# Patient Record
Sex: Male | Born: 1955 | Race: Black or African American | Hispanic: No | Marital: Married | Smoking: Former smoker
Health system: Southern US, Community
[De-identification: ages and names within clinical notes are randomized; demographics above are authoritative.]

## PROBLEM LIST (undated history)

## (undated) DIAGNOSIS — I10 Essential (primary) hypertension: Secondary | ICD-10-CM

## (undated) DIAGNOSIS — R32 Unspecified urinary incontinence: Secondary | ICD-10-CM

## (undated) DIAGNOSIS — E119 Type 2 diabetes mellitus without complications: Secondary | ICD-10-CM

## (undated) DIAGNOSIS — I639 Cerebral infarction, unspecified: Secondary | ICD-10-CM

## (undated) DIAGNOSIS — R159 Full incontinence of feces: Secondary | ICD-10-CM

## (undated) DIAGNOSIS — E785 Hyperlipidemia, unspecified: Secondary | ICD-10-CM

## (undated) HISTORY — DX: Hyperlipidemia, unspecified: E78.5

---

## 2002-02-12 ENCOUNTER — Ambulatory Visit: Admission: RE | Admit: 2002-02-12 | Discharge: 2002-02-12 | Payer: Self-pay | Admitting: Family Medicine

## 2002-03-14 ENCOUNTER — Emergency Department (HOSPITAL_COMMUNITY): Admission: EM | Admit: 2002-03-14 | Discharge: 2002-03-14 | Payer: Self-pay | Admitting: Emergency Medicine

## 2004-02-22 ENCOUNTER — Ambulatory Visit (HOSPITAL_BASED_OUTPATIENT_CLINIC_OR_DEPARTMENT_OTHER): Admission: RE | Admit: 2004-02-22 | Discharge: 2004-02-22 | Payer: Self-pay | Admitting: Family Medicine

## 2009-08-07 ENCOUNTER — Emergency Department (HOSPITAL_COMMUNITY): Admission: EM | Admit: 2009-08-07 | Discharge: 2009-08-07 | Payer: Self-pay | Admitting: Family Medicine

## 2009-08-13 ENCOUNTER — Encounter: Admission: RE | Admit: 2009-08-13 | Discharge: 2009-08-13 | Payer: Self-pay | Admitting: Podiatrist

## 2012-04-29 ENCOUNTER — Telehealth: Payer: Self-pay | Admitting: Urgent Care

## 2012-04-29 LAB — BASIC METABOLIC PANEL
AST: 17 U/L
Albumin: 4
Bilirubin, Indirect: 0.4
Chloride: 105 mmol/L
Sodium: 142 mmol/L (ref 137–147)
Total Bilirubin: 0.5 mg/dL
Total Protein: 6.9 g/dL

## 2012-04-29 NOTE — Telephone Encounter (Signed)
Pt called to be set up for his TCS per Dr [name]. He isn't having problems, just due for procedure. Please call him for a triage at [phone]

## 2012-04-29 NOTE — Telephone Encounter (Signed)
Pt called back. Said Dr. Rosemary Holms referred him. He is having problems with constipation and now has an OV on 05/08/2012 @ 10:30 AM with Laban Emperor, NP.

## 2012-05-01 ENCOUNTER — Emergency Department (HOSPITAL_COMMUNITY)
Admission: EM | Admit: 2012-05-01 | Discharge: 2012-05-01 | Disposition: A | Discharge status: Home / Self Care | Payer: 59 | Attending: Emergency Medicine | Admitting: Emergency Medicine

## 2012-05-01 ENCOUNTER — Encounter (HOSPITAL_COMMUNITY): Payer: Self-pay | Admitting: Emergency Medicine

## 2012-05-01 ENCOUNTER — Emergency Department (HOSPITAL_COMMUNITY): Payer: 59

## 2012-05-01 DIAGNOSIS — F17200 Nicotine dependence, unspecified, uncomplicated: Secondary | ICD-10-CM | POA: Insufficient documentation

## 2012-05-01 DIAGNOSIS — R51 Headache: Secondary | ICD-10-CM | POA: Insufficient documentation

## 2012-05-01 DIAGNOSIS — I10 Essential (primary) hypertension: Secondary | ICD-10-CM

## 2012-05-01 HISTORY — DX: Essential (primary) hypertension: I10

## 2012-05-01 LAB — BASIC METABOLIC PANEL
CO2: 29 mEq/L (ref 19–32)
Chloride: 102 mEq/L (ref 96–112)
GFR calc Af Amer: >90 mL/min (ref >90)
GFR calc non Af Amer: >90 mL/min (ref >90)
Glucose, Bld: 98 mg/dL (ref 70–99)

## 2012-05-01 MED ORDER — ACETAMINOPHEN 500 MG PO TABS
1000.0000 mg | ORAL_TABLET | Freq: Once | ORAL | Status: AC
  Administered 2012-05-01: 1000 mg via ORAL
  Filled 2012-05-01: qty 2

## 2012-05-01 MED ORDER — POTASSIUM CHLORIDE CRYS ER 20 MEQ PO TBCR
40.0000 meq | EXTENDED_RELEASE_TABLET | Freq: Once | ORAL | Status: AC
  Administered 2012-05-01: 40 meq via ORAL
  Filled 2012-05-01: qty 2

## 2012-05-01 MED ORDER — BUTALBITAL-APAP-CAFF-COD 50-325-40-30 MG PO CAPS: 1.0000 | ORAL_CAPSULE | ORAL | Status: AC | PRN

## 2012-05-01 MED ORDER — HYDROCHLOROTHIAZIDE 25 MG PO TABS
25.0000 mg | ORAL_TABLET | Freq: Every day | ORAL | Status: DC
  Administered 2012-05-01: 25 mg via ORAL
  Filled 2012-05-01 (×2): qty 1

## 2012-05-01 MED ORDER — HYDROCHLOROTHIAZIDE 25 MG PO TABS: 25.0000 mg | ORAL_TABLET | Freq: Every day | ORAL | Status: DC

## 2012-05-01 NOTE — ED Provider Notes (Signed)
History     CSN: [id number]  Arrival date & time 05/01/12  0815   None     Chief Complaint  Patient presents with  . Hypertension    (Consider location/radiation/quality/duration/timing/severity/associated sxs/prior treatment) HPI Comments: Patient states he has a history of hypertension. He has a local physician and was ordered medication for his blood pressure in February of this year. He used the medication up until April when he ran out. Patient states he has had problems with his job and his insurance, and has not been back to his physician to get the prescription refilled from April up until now. The patient presents to the emergency department because he is having headaches when with cough. States that his head feels as though was going to explode when he coughs. He describes a mild throbbing type headache when he is not coughing. The patient denies any loss of consciousness, changes in vision, changes in speech, extremity weakness, or changes in his ability to walk. The patient states that he was seen by the physician on Friday, July 12. He was given another prescription but could not afford to get it filled. He presents at this time for evaluation of his headache and also his blood pressure.  The history is provided by the patient.    Past Medical History  Diagnosis Date  . Hypertension     History reviewed. No pertinent past surgical history.  History reviewed. No pertinent family history.  History  Substance Use Topics  . Smoking status: Current Everyday Smoker -- 1.0 packs/day    Types: Cigarettes  . Smokeless tobacco: Not on file  . Alcohol Use: Yes     occ      Review of Systems  Constitutional: Negative for activity change.       All ROS Neg except as noted in HPI  HENT: Negative for nosebleeds and neck pain.   Eyes: Negative for photophobia and discharge.  Respiratory: Negative for cough, shortness of breath and wheezing.   Cardiovascular: Negative for  chest pain and palpitations.  Gastrointestinal: Negative for abdominal pain and blood in stool.  Genitourinary: Negative for dysuria, frequency and hematuria.  Musculoskeletal: Negative for back pain and arthralgias.  Skin: Negative.   Neurological: Positive for headaches. Negative for dizziness, seizures and speech difficulty.  Psychiatric/Behavioral: Negative for hallucinations and confusion.    Allergies  Bee venom  Home Medications  No current outpatient prescriptions on file.  BP 205/97  Pulse 58  Temp 98.2 F (36.8 C) (Oral)  Resp 16  Ht 5\' 4"  (1.626 m)  Wt 160 lb (72.576 kg)  BMI 27.46 kg/m2  SpO2 100%  Physical Exam  Nursing note and vitals reviewed. Constitutional: He is oriented to person, place, and time. He appears well-developed and well-nourished.  Non-toxic appearance.  HENT:  Head: Normocephalic.  Right Ear: Tympanic membrane and external ear normal.  Left Ear: Tympanic membrane and external ear normal.  Eyes: EOM and lids are normal. Pupils are equal, round, and reactive to light.  Neck: Normal range of motion. Neck supple. Carotid bruit is not present.  Cardiovascular: Regular rhythm, intact distal pulses and normal pulses.  Bradycardia present.   Murmur heard.  Systolic murmur is present  Pulmonary/Chest: Breath sounds normal. No respiratory distress.  Abdominal: Soft. Bowel sounds are normal. There is no tenderness. There is no guarding.  Musculoskeletal: Normal range of motion.  Lymphadenopathy:       Head (right side): No submandibular adenopathy present.  Head (left side): No submandibular adenopathy present.    He has no cervical adenopathy.  Neurological: He is alert and oriented to person, place, and time. He has normal strength. No cranial nerve deficit or sensory deficit. He exhibits normal muscle tone. Coordination normal.       No motor or sensory deficit. Speech clear.  Skin: Skin is warm and dry.  Psychiatric: He has a normal mood  and affect. His speech is normal.    ED Course  Procedures (including critical care time)  Labs Reviewed - No data to display No results found.   No diagnosis found.    MDM  I have reviewed nursing notes, vital signs, and all appropriate lab and imaging results for this patient. Basic metabolic panel reveals the potassium to be 3.3. 40 mEq of potassium by mouth given to the patient. CT scan of the head reveals atrophy with small vessel chronic ischemic changes there is an old lacunar infarct but no acute changes noted. The patient's blood pressure improved from a systolic of [id number] systolic of [id number]. Patient is prescribed hydrochlorothiazide 1 tablet daily he is also given a prescription for fioricet with codeine #3 for headache. Patient is advised to see his primary physician in the next 7-10 days for recheck of his blood pressure and followup. He is invited to return to the emergency department if any changes, problems, or concerns.       Lenox Ahr, [place] 05/01/12 1032

## 2012-05-01 NOTE — ED Notes (Signed)
Pt states stopped taking BP meds about 6 months ago. Pt complaining of headache and throbbing feeling when he coughs in head.

## 2012-05-01 NOTE — ED Provider Notes (Signed)
Medical screening examination/treatment/procedure(s) were performed by non-physician practitioner and as supervising physician I was immediately available for consultation/collaboration.   Sylvia Kondracki L Caesar Mannella, MD 05/01/12 1559 

## 2012-05-08 ENCOUNTER — Ambulatory Visit: Payer: Self-pay | Admitting: Gastroenterology

## 2012-05-09 ENCOUNTER — Ambulatory Visit (INDEPENDENT_AMBULATORY_CARE_PROVIDER_SITE_OTHER): Payer: 59 | Admitting: Gastroenterology

## 2012-05-09 ENCOUNTER — Encounter: Payer: Self-pay | Admitting: Gastroenterology

## 2012-05-09 VITALS — BP 192/93 | HR 58 | Temp 98.6°F | Ht 64.0 in | Wt 152.6 lb

## 2012-05-09 DIAGNOSIS — R198 Other specified symptoms and signs involving the digestive system and abdomen: Secondary | ICD-10-CM

## 2012-05-09 DIAGNOSIS — R194 Change in bowel habit: Secondary | ICD-10-CM

## 2012-05-09 NOTE — Assessment & Plan Note (Signed)
56 year old male with abrupt change in bowel habits, onset 6 months ago. Previously BM daily to every other day and well-formed. Now, with postprandial urgency occuring 1-2x per week with watery loose stool, otherwise no bowel movements. Associated with wt loss of 7-8 lbs, loss of appetite. No abdominal pain. No rectal bleeding. Ultimately needs TCS, but I am concerned about semi-obstructive process. Obtain CT first. After review and if no abnormalities, proceed with TCS in very near future. Pt aware. The R/B/A have been discussed in detail, and he understands. Probiotics provided today.

## 2012-05-09 NOTE — Progress Notes (Signed)
Referring Provider: [name], MD Primary Care Physician:  Seth Battiest, MD Primary Gastroenterologist:  Seth Neal   Chief Complaint  Patient presents with  . Diarrhea  . Constipation  . Colonoscopy    HPI:   56 year old male who presents today at the request of Seth Neal for initial screening colonoscopy. Unfortunately, he is overdue for this. His BP is quite elevated today, but he is asymptomatic. He has not taken his medication today. Seth Neal reports an abrupt change in bowel habits starting 6 months ago. Notes significantly watery, loose stools, occuring 1-2 times per week, usually postprandially. Prior to this would have a BM once a day or every other day, well-formed. He has associated lack of appetite and self-reported wt loss of 7-8 lbs. Denies N/V. He denies any sick contacts, use of abx, change in medication. Has city water. He has had no formed stool for the past 6 months.   Outside labs reviewed with normal BUN, Cr, and LFTs.   Past Medical History  Diagnosis Date  . Hypertension     Past Surgical History  Procedure Date  . None     Current Outpatient Prescriptions  Medication Sig Dispense Refill  . hydrochlorothiazide (HYDRODIURIL) 25 MG tablet Take 1 tablet (25 mg total) by mouth daily.  30 tablet  0  . butalbital-acetaminophen-caffeine (FIORICET/CODEINE) 50-325-40-30 MG per capsule Take 1 capsule by mouth every 4 (four) hours as needed for headache. Take with food  20 capsule  0    Allergies as of 05/09/2012 - Review Complete 05/09/2012  Allergen Reaction Noted  . Bee venom Hypertension 05/01/2012    Family History  Problem Relation Age of Onset  . Colon cancer Neg Hx     History   Social History  . Marital Status: Married    Spouse Name: N/A    Number of Children: N/A  . Years of Education: N/A   Occupational History  . Not on file.   Social History Main Topics  . Smoking status: Current Everyday Smoker -- 1.0 packs/day    Types:  Cigarettes  . Smokeless tobacco: Not on file  . Alcohol Use: Yes     occ  . Drug Use: No  . Sexually Active: Not on file   Other Topics Concern  . Not on file   Social History Narrative  . No narrative on file    Review of Systems: Gen: SEE HPI CV: Denies chest pain, heart palpitations, syncope, peripheral edema. Resp: Denies shortness of breath with rest, cough, wheezing GI: SEE HPI GU : Denies urinary burning, urinary frequency, urinary incontinence.  MS: Denies joint pain, muscle weakness, cramps, limited movement Derm: Denies rash, itching, dry skin Psych: Denies depression, anxiety, confusion or memory loss  Heme: Denies bruising, bleeding, and enlarged lymph nodes.  Physical Exam: BP 192/93  Pulse 58  Temp 98.6 F (37 C) (Temporal)  Ht 5\' 4"  (1.626 m)  Wt 152 lb 9.6 oz (69.219 kg)  BMI 26.19 kg/m2 General:   Alert and oriented. Well-developed, well-nourished, pleasant and cooperative. Head:  Normocephalic and atraumatic. Eyes:  Conjunctiva pink, sclera clear, no icterus.    Ears:  Normal auditory acuity. Nose:  No deformity, discharge,  or lesions. Mouth:  No deformity or lesions, mucosa pink and moist.  Neck:  Supple, without mass or thyromegaly. Lungs:  Clear to auscultation bilaterally, without wheezing, rales, or rhonchi.  Heart:  S1, S2 present without murmurs noted.  Abdomen:  +BS, soft, non-tender and non-distended.  Without HSM. No rebound or guarding. Right-sided abdomen with fullness palpated, difficult to ascertain etiology Rectal:  Deferred  Msk:  Symmetrical without gross deformities. Normal posture. Extremities:  Without clubbing or edema. Neurologic:  Alert and  oriented x4;  grossly normal neurologically. Skin:  Intact, warm and dry without significant lesions or rashes Cervical Nodes:  No significant cervical adenopathy. Psych:  Alert and cooperative. Normal mood and affect.

## 2012-05-09 NOTE — Patient Instructions (Addendum)
We are setting you up for a CT scan of your belly.  You will have a colonoscopy in the near future after the CT is completed.  Start taking a probiotic daily.  Further recommendations after CT is completed.

## 2012-05-10 ENCOUNTER — Other Ambulatory Visit: Payer: Self-pay | Admitting: Gastroenterology

## 2012-05-10 DIAGNOSIS — R194 Change in bowel habit: Secondary | ICD-10-CM

## 2012-05-10 MED ORDER — PEG 3350-KCL-NA BICARB-NACL 420 G PO SOLR: ORAL | Status: AC

## 2012-05-10 NOTE — Progress Notes (Signed)
Faxed to PCP

## 2012-05-13 ENCOUNTER — Other Ambulatory Visit: Payer: Self-pay | Admitting: Gastroenterology

## 2012-05-13 ENCOUNTER — Encounter: Payer: Self-pay | Admitting: Gastroenterology

## 2012-05-13 MED ORDER — PEG 3350-KCL-NA BICARB-NACL 420 G PO SOLR: ORAL | Status: AC

## 2012-05-14 ENCOUNTER — Other Ambulatory Visit (HOSPITAL_COMMUNITY): Payer: 59

## 2012-05-14 ENCOUNTER — Ambulatory Visit (HOSPITAL_COMMUNITY)
Admission: RE | Admit: 2012-05-14 | Discharge: 2012-05-14 | Disposition: A | Discharge status: Home / Self Care | Payer: 59 | Source: Ambulatory Visit | Attending: Gastroenterology | Admitting: Gastroenterology

## 2012-05-14 DIAGNOSIS — R194 Change in bowel habit: Secondary | ICD-10-CM

## 2012-05-14 DIAGNOSIS — M87059 Idiopathic aseptic necrosis of unspecified femur: Secondary | ICD-10-CM | POA: Insufficient documentation

## 2012-05-14 DIAGNOSIS — R1903 Right lower quadrant abdominal swelling, mass and lump: Secondary | ICD-10-CM | POA: Insufficient documentation

## 2012-05-14 DIAGNOSIS — N400 Enlarged prostate without lower urinary tract symptoms: Secondary | ICD-10-CM | POA: Insufficient documentation

## 2012-05-14 DIAGNOSIS — R198 Other specified symptoms and signs involving the digestive system and abdomen: Secondary | ICD-10-CM | POA: Insufficient documentation

## 2012-05-14 DIAGNOSIS — R634 Abnormal weight loss: Secondary | ICD-10-CM | POA: Insufficient documentation

## 2012-05-14 DIAGNOSIS — D3500 Benign neoplasm of unspecified adrenal gland: Secondary | ICD-10-CM | POA: Insufficient documentation

## 2012-05-14 MED ORDER — IOHEXOL 300 MG/ML  SOLN
100.0000 mL | Freq: Once | INTRAMUSCULAR | Status: AC | PRN
  Administered 2012-05-14: 100 mL via INTRAVENOUS

## 2012-05-16 NOTE — Progress Notes (Signed)
Quick Note:  Attempted to inform pt of results.  Unable to leave message as mailbox is full.   Please send this report to PCP. 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  Please send letter to pt to call our office for full results.  TCS on 8/2. ______

## 2012-05-17 ENCOUNTER — Ambulatory Visit (HOSPITAL_COMMUNITY)
Admission: RE | Admit: 2012-05-17 | Discharge: 2012-05-17 | Disposition: A | Discharge status: Home / Self Care | Payer: 59 | Source: Ambulatory Visit | Attending: Gastroenterology | Admitting: Gastroenterology

## 2012-05-17 ENCOUNTER — Encounter (HOSPITAL_COMMUNITY)
Admission: RE | Disposition: A | Discharge status: Home / Self Care | Payer: Self-pay | Source: Ambulatory Visit | Attending: Gastroenterology

## 2012-05-17 ENCOUNTER — Encounter (HOSPITAL_COMMUNITY): Payer: Self-pay | Admitting: *Deleted

## 2012-05-17 DIAGNOSIS — R197 Diarrhea, unspecified: Secondary | ICD-10-CM

## 2012-05-17 DIAGNOSIS — I10 Essential (primary) hypertension: Secondary | ICD-10-CM | POA: Insufficient documentation

## 2012-05-17 DIAGNOSIS — D128 Benign neoplasm of rectum: Secondary | ICD-10-CM | POA: Insufficient documentation

## 2012-05-17 DIAGNOSIS — D126 Benign neoplasm of colon, unspecified: Secondary | ICD-10-CM | POA: Insufficient documentation

## 2012-05-17 DIAGNOSIS — D129 Benign neoplasm of anus and anal canal: Secondary | ICD-10-CM | POA: Insufficient documentation

## 2012-05-17 DIAGNOSIS — R194 Change in bowel habit: Secondary | ICD-10-CM

## 2012-05-17 DIAGNOSIS — K5289 Other specified noninfective gastroenteritis and colitis: Secondary | ICD-10-CM | POA: Insufficient documentation

## 2012-05-17 DIAGNOSIS — Z79899 Other long term (current) drug therapy: Secondary | ICD-10-CM | POA: Insufficient documentation

## 2012-05-17 DIAGNOSIS — K648 Other hemorrhoids: Secondary | ICD-10-CM | POA: Insufficient documentation

## 2012-05-17 HISTORY — PX: COLONOSCOPY: SHX5424

## 2012-05-17 SURGERY — COLONOSCOPY
Anesthesia: Moderate Sedation

## 2012-05-17 MED ORDER — STERILE WATER FOR IRRIGATION IR SOLN
Status: DC | PRN
  Administered 2012-05-17: 12:00:00

## 2012-05-17 MED ORDER — HYDRALAZINE HCL 20 MG/ML IJ SOLN
10.0000 mg | Freq: Once | INTRAMUSCULAR | Status: AC
  Administered 2012-05-17: 10 mg via INTRAVENOUS

## 2012-05-17 MED ORDER — SODIUM CHLORIDE 0.45 % IV SOLN
Freq: Once | INTRAVENOUS | Status: AC
  Administered 2012-05-17: 1000 mL via INTRAVENOUS

## 2012-05-17 MED ORDER — MIDAZOLAM HCL 5 MG/5ML IJ SOLN
INTRAMUSCULAR | Status: AC
  Filled 2012-05-17: qty 10

## 2012-05-17 MED ORDER — HYDRALAZINE HCL 20 MG/ML IJ SOLN
INTRAMUSCULAR | Status: AC
  Filled 2012-05-17: qty 1

## 2012-05-17 MED ORDER — MIDAZOLAM HCL 5 MG/5ML IJ SOLN
INTRAMUSCULAR | Status: DC | PRN
  Administered 2012-05-17 (×3): 2 mg via INTRAVENOUS

## 2012-05-17 MED ORDER — MEPERIDINE HCL 100 MG/ML IJ SOLN
INTRAMUSCULAR | Status: AC
  Filled 2012-05-17: qty 2

## 2012-05-17 MED ORDER — MEPERIDINE HCL 100 MG/ML IJ SOLN
INTRAMUSCULAR | Status: DC | PRN
  Administered 2012-05-17 (×2): 25 mg
  Administered 2012-05-17: 50 mg

## 2012-05-17 NOTE — Progress Notes (Signed)
Quick Note:  Letter sent to pt to call. Routing to [facility] to send to PCP. ______

## 2012-05-17 NOTE — H&P (Signed)
  Primary Care Physician:  [name], MD Primary Gastroenterologist:  Dr. Oneida Alar  Pre-Procedure History & Physical: HPI:  Seth Neal is a 56 y.o. male here for [place].   Past Medical History  Diagnosis Date  . Hypertension     Past Surgical History  Procedure Date  . None     Prior to Admission medications   Medication Sig Start Date End Date Taking? Authorizing Provider  hydrochlorothiazide (HYDRODIURIL) 25 MG tablet Take 1 tablet (25 mg total) by mouth daily. 05/01/12 05/01/13 Yes Lenox Ahr, PA    Allergies as of 05/10/2012 - Review Complete 05/09/2012  Allergen Reaction Noted  . Bee venom Hypertension 05/01/2012    Family History  Problem Relation Age of Onset  . Colon cancer Neg Hx     History   Social History  . Marital Status: Married    Spouse Name: N/A    Number of Children: N/A  . Years of Education: N/A   Occupational History  . Not on file.   Social History Main Topics  . Smoking status: Current Everyday Smoker -- 1.0 packs/day    Types: Cigarettes  . Smokeless tobacco: Not on file  . Alcohol Use: Yes     occ  . Drug Use: No  . Sexually Active: Not on file   Other Topics Concern  . Not on file   Social History Narrative  . No narrative on file    Review of Systems: See HPI, otherwise negative ROS   Physical Exam: BP 217/107  Pulse 56  Temp 98 F (36.7 C) (Oral)  Resp 16  SpO2 96% General:   Alert,  pleasant and cooperative in NAD Head:  Normocephalic and atraumatic. Neck:  Supple; no masses or thyromegaly. Lungs:  Clear throughout to auscultation.    Heart:  Regular rate and rhythm. Abdomen:  Soft, nontender and nondistended. Normal bowel sounds, without guarding, and without rebound.   Neurologic:  Alert and  oriented x4;  grossly normal neurologically.  Impression/Plan:     Change in bowel habits  PLAN:  1. TCS TODAY

## 2012-05-17 NOTE — Progress Notes (Signed)
Faxed to PCP

## 2012-05-17 NOTE — Op Note (Signed)
[facility] [address] [place], [place]  [address]  COLONOSCOPY PROCEDURE REPORT  PATIENT:  Seth Neal, Seth Neal  MR#:  [id number] BIRTHDATE:  Feb 16, 1956, 54 yrs. old  GENDER:  male  ENDOSCOPIST:  Barney Drain, MD REF. BY:  Rosemary Holms, M.D. ASSISTANT:  PROCEDURE DATE:  05/17/2012 PROCEDURE:  Colonoscopy with biopsy and snare polypectomy, RESOLUTION CLIP PLACEMENT TO CONTROL BLEEDING  INDICATIONS:  CHANGE IN BOWEL HABITS-INTERMITTENT DIARRHEA NOT ASSOCIATED WITH ABD PAIN AND OCCURRING AFTER EATING  MEDICATIONS:   Demerol 100 mg IV, Versed 6 mg IV, HYDRALAZINE 10 MG IV  DESCRIPTION OF PROCEDURE:    Physical exam was performed. Informed consent was obtained from the patient after explaining the benefits, risks, and alternatives to procedure.  The patient was connected to monitor and placed in left lateral position. Continuous oxygen was provided by nasal cannula and IV medicine administered through an indwelling cannula.  After administration of sedation and rectal exam, the patient's rectum was intubated and the [id number] colonoscope was advanced under direct visualization to the cecum.  The scope was removed slowly by carefully examining the color, texture, anatomy, and integrity mucosa on the way out.  The patient was recovered in endoscopy and discharged home in satisfactory condition. <<PROCEDUREIMAGES>>  FINDINGS:  There were TWO 6 MM SESSILE polyps identified and removed. in the transverse colon VIA SNARE CAUTERY. THE HEPATIC FLEXURE POLYP BLED AND ADDITIONAL CAUTERY APPLIED TO THE BASE BUT THE LESION CONTINUED TO BLEED. HEMOSTASIS WAS ACHIEVED WITH RESOLUTION CLIP PLACEMENT(x1).  There were TWO 3-6 MM SESSILE polyps identified and removed. in the recto-sigmoid colon VIA SNARE CAUTERY/COLD FORCEPS. 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 SMALL  Internal Hemorrhoids were found. NO DIVERTICULA.  PREP QUALITY:  EXCELLENT CECAL W/D TIME:    99991111 minutes  COMPLICATIONS:    None  ENDOSCOPIC IMPRESSION: 1) Polyps, multiple in the transverse colon 2) Polyps, multiple in the recto-sigmoid colon 3) Internal hemorrhoids 4) INTERMITTENT DIARRHEA MOST LIKELY DUE TO DIETARY INTOLERANCE  RECOMMENDATIONS: AWAIT BIOPSY NO ASA FOR 7 DAYS. NO MRI FOR 30 DAYS. HIGH FIBER/LACTOSE FREE DIET OPV IN 2 MOS TCS IN 10 YEARS WITH OVERTUBE  REPEAT EXAM:  No  ______________________________ Barney Drain, MD  CC:  Rosemary Holms, M.D.  n. eSIGNED:   Jennifer Holland at 05/17/2012 01:29 PM  Minta Balsam, [id number]

## 2012-05-21 ENCOUNTER — Other Ambulatory Visit (HOSPITAL_COMMUNITY): Payer: 59

## 2012-05-21 ENCOUNTER — Encounter (HOSPITAL_COMMUNITY): Payer: Self-pay | Admitting: Gastroenterology

## 2012-05-30 ENCOUNTER — Telehealth: Payer: Self-pay | Admitting: Gastroenterology

## 2012-05-30 NOTE — Telephone Encounter (Signed)
I called Pt to let him know that the results was not back. As soon as we get them I will call you and let you know.

## 2012-05-30 NOTE — Telephone Encounter (Signed)
Patient is wanting procedure and path results

## 2012-06-02 ENCOUNTER — Telehealth: Payer: Self-pay | Admitting: Gastroenterology

## 2012-06-02 NOTE — Telephone Encounter (Signed)
Called patient TO DISCUSS RESULTS. NO ANSWER-MAILBOX IS FULL. NEED TO DISCUSS FINDINGS WITH PT. NO MRI UNTIL SEP 3. FOLLOW A HIGH FIBER/LACTOSE FREE DIET. IF STILL HAVING LOOSE STOOLS WOULD Rx: Augmentin 500 mg bid for 10 days. FOLLOW UP IN OCT 2013.

## 2012-06-03 ENCOUNTER — Telehealth: Payer: Self-pay

## 2012-06-03 NOTE — Telephone Encounter (Signed)
Pt came by office. He is aware that Dr. Oneida Alar wanted to discuss findings with him. 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 ( See phone note of 06/02/2012) I informed pt of the info in the note per Dr. Oneida Alar. Diets given. He said he is not having any loose stools and BM's are normal. Per Dr. Oneida Alar, follow up in [place]. He will be expecting a call from Dr. Oneida Alar. He is aware that she is at the hospital today.

## 2012-06-03 NOTE — Telephone Encounter (Signed)
See phone note dated 06/03/2012.

## 2012-06-03 NOTE — Telephone Encounter (Signed)
REVIEWED.  

## 2012-06-05 NOTE — Telephone Encounter (Signed)
Called patient TO DISCUSS RESULTS. MAILBOX IS FULL.

## 2012-06-05 NOTE — Progress Notes (Signed)
2013 AUG TCS COLITIS, HYPERPLASTIC POLYPS  HIGH FIBER/ lactose free diet  REVIEWED.

## 2012-06-06 NOTE — Telephone Encounter (Signed)
Called patient TO DISCUSS RESULTS. MAILBOX IS FULL.

## 2012-06-12 NOTE — Telephone Encounter (Signed)
Reminder in epic to follow up with SF in 2 months

## 2012-06-12 NOTE — Telephone Encounter (Signed)
Mailed letter for pt to call for results.  

## 2012-06-12 NOTE — Telephone Encounter (Signed)
Called patient TO DISCUSS RESULTS. MAILBOX IS FULL.  SEND PT LETTER TO CONTACT OFFICE FOR RESULTS.

## 2012-06-24 ENCOUNTER — Telehealth: Payer: Self-pay

## 2012-06-24 NOTE — Telephone Encounter (Signed)
Pt received a letter to call for results after calls were made and mailbox was full. On separate note of 06/03/2012 pt had come by the office and was informed of the results. He is still doing fine and no problems. If Dr. 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

## 2012-06-25 NOTE — Telephone Encounter (Signed)
REVIEWED.  

## 2012-08-14 ENCOUNTER — Emergency Department (HOSPITAL_COMMUNITY): Payer: 59

## 2012-08-14 ENCOUNTER — Emergency Department (HOSPITAL_COMMUNITY)
Admission: EM | Admit: 2012-08-14 | Discharge: 2012-08-14 | Disposition: A | Discharge status: Home / Self Care | Payer: 59 | Attending: Emergency Medicine | Admitting: Emergency Medicine

## 2012-08-14 DIAGNOSIS — I10 Essential (primary) hypertension: Secondary | ICD-10-CM

## 2012-08-14 DIAGNOSIS — F17200 Nicotine dependence, unspecified, uncomplicated: Secondary | ICD-10-CM | POA: Insufficient documentation

## 2012-08-14 DIAGNOSIS — R4789 Other speech disturbances: Secondary | ICD-10-CM | POA: Insufficient documentation

## 2012-08-14 LAB — COMPREHENSIVE METABOLIC PANEL
ALT: 11 U/L (ref 0–53)
Albumin: 4 g/dL (ref 3.5–5.2)
Alkaline Phosphatase: 99 U/L (ref 39–117)
Calcium: 10 mg/dL (ref 8.4–10.5)
Potassium: 3 mEq/L — ABNORMAL LOW (ref 3.5–5.1)
Sodium: 140 mEq/L (ref 135–145)
Total Protein: 7.9 g/dL (ref 6.0–8.3)

## 2012-08-14 LAB — CBC WITH DIFFERENTIAL/PLATELET
Basophils Relative: 0 % (ref 0–1)
Eosinophils Absolute: 0.3 10*3/uL (ref 0.0–0.7)
Eosinophils Relative: 3 % (ref 0–5)
MCH: 31.1 pg (ref 26.0–34.0)
MCHC: 35.3 g/dL (ref 30.0–36.0)
Neutrophils Relative %: 57 % (ref 43–77)
Platelets: 249 10*3/uL (ref 150–400)
RDW: 13.9 % (ref 11.5–15.5)

## 2012-08-14 MED ORDER — POTASSIUM CHLORIDE CRYS ER 20 MEQ PO TBCR
40.0000 meq | EXTENDED_RELEASE_TABLET | Freq: Once | ORAL | Status: AC
  Administered 2012-08-14: 40 meq via ORAL
  Filled 2012-08-14: qty 2

## 2012-08-14 MED ORDER — HYDROCHLOROTHIAZIDE 25 MG PO TABS: 25.0000 mg | ORAL_TABLET | Freq: Every day | ORAL | Status: DC

## 2012-08-14 MED ORDER — HYDROCHLOROTHIAZIDE 25 MG PO TABS
ORAL_TABLET | ORAL | Status: AC
  Filled 2012-08-14: qty 1

## 2012-08-14 MED ORDER — HYDROCHLOROTHIAZIDE 25 MG PO TABS
25.0000 mg | ORAL_TABLET | Freq: Every day | ORAL | Status: DC
  Administered 2012-08-14: 25 mg via ORAL
  Filled 2012-08-14 (×2): qty 1

## 2012-08-14 NOTE — ED Provider Notes (Signed)
History    This chart was scribed for Seth Diego, MD, MD by Rhae Lerner. The patient was seen in room APA01 and the patient's care was started at 6:58PM.   CSN: [id number]  Arrival date & time 08/14/12  1832      Chief Complaint  Patient presents with  . Dizziness    (Consider location/radiation/quality/duration/timing/severity/associated sxs/prior treatment) The history is provided by the patient. No language interpreter was used.   Seth Neal is a 56 y.o. male who presents to the Emergency Department BIB family due to intermittent, moderate dizziness and slurred speech onset 3 months ago. Family reports that his speech is slurred and they can not understand him at times. He has had equilibrium problems. Family reports that he has lost 30 lbs. Pt reports that he drinks 1 beer/day.    Past Medical History  Diagnosis Date  . Hypertension     Past Surgical History  Procedure Date  . None   . Colonoscopy 05/17/2012    Procedure: COLONOSCOPY;  Surgeon: Danie Binder, MD;  Location: [facility];  Service: Endoscopy;  Laterality: N/A;  12:30    Family History  Problem Relation Age of Onset  . Colon cancer Neg Hx     History  Substance Use Topics  . Smoking status: Current Every Day Smoker -- 1.0 packs/day    Types: Cigarettes  . Smokeless tobacco: Not on file  . Alcohol Use: Yes     occ      Review of Systems  Constitutional: Negative for fatigue.  HENT: Negative for congestion, sinus pressure and ear discharge.   Eyes: Negative for discharge.  Respiratory: Negative for cough.   Cardiovascular: Negative for chest pain.  Gastrointestinal: Negative for abdominal pain and diarrhea.  Genitourinary: Negative for frequency and hematuria.  Musculoskeletal: Negative for back pain.  Skin: Negative for rash.  Neurological: Positive for dizziness and speech difficulty. Negative for seizures and headaches.  Hematological: Negative.   Psychiatric/Behavioral:  Negative for hallucinations.    Allergies  Bee venom  Home Medications   Current Outpatient Rx  Name Route Sig Dispense Refill  . HYDROCHLOROTHIAZIDE 25 MG PO TABS Oral Take 1 tablet (25 mg total) by mouth daily. 30 tablet 0    BP 223/124  Pulse 80  Temp 98.5 F (36.9 C) (Oral)  Resp 20  Ht 5\' 4"  (1.626 m)  Wt 135 lb (61.236 kg)  BMI 23.17 kg/m2  SpO2 100%  Physical Exam  Nursing note and vitals reviewed. Constitutional: He is oriented to person, place, and time. He appears well-developed.  HENT:  Head: Normocephalic and atraumatic.  Eyes: Conjunctivae normal and EOM are normal. No scleral icterus.  Neck: Neck supple. No thyromegaly present.  Cardiovascular: Normal rate and regular rhythm.  Exam reveals no gallop and no friction rub.   No murmur heard. Pulmonary/Chest: No stridor. He has no wheezes. He has no rales. He exhibits no tenderness.  Abdominal: He exhibits no distension. There is no tenderness. There is no rebound.  Musculoskeletal: Normal range of motion. He exhibits no edema.  Lymphadenopathy:    He has no cervical adenopathy.  Neurological: He is alert and oriented to person, place, and time. No cranial nerve deficit. Coordination normal.       Nl finger to nose bilaterally Nl heel to shin bilaterally   Skin: No rash noted. No erythema.  Psychiatric: He has a normal mood and affect. His behavior is normal.    ED Course  Procedures (  including critical care time) DIAGNOSTIC STUDIES: Oxygen Saturation is 100% on room air, normal by my interpretation.    COORDINATION OF CARE: 7:03 PM Discussed ED treatment with pt     Labs Reviewed  COMPREHENSIVE METABOLIC PANEL - Abnormal; Notable for the following:    Potassium 3.0 (*)     Glucose, Bld 101 (*)     All other components within normal limits  CBC WITH DIFFERENTIAL   Ct Head Wo Contrast  08/14/2012  *RADIOLOGY REPORT*  Clinical Data: Dizziness.  Slurred speech beginning 3 months ago.  CT HEAD  WITHOUT CONTRAST  Technique:  Contiguous axial images were obtained from the base of the skull through the vertex without contrast.  Comparison: CT head without contrast 05/01/2012.  Findings: Remote lacunar infarcts of the basal ganglia bilaterally are stable.  Atrophy and extensive white matter disease is similar to the prior exam.  No acute cortical infarct, hemorrhage, or mass lesion is present.  The ventricles are of normal size.  A remote lacunar infarct is present in the left pons.  Mucosal thickening in the left maxillary sinus is new.  A remote blowout fracture is again noted on the right.  The left frontal sinus is near totally opacified.  The mastoid air cells are clear. Atherosclerotic calcifications are noted in the cavernous carotid arteries.  IMPRESSION:  1.  Stable remote lacunar infarcts of the basal ganglia. 2.  Atrophy and diffuse white matter disease is similar to the prior exam. 3.  Slight progression of sinus disease in the left frontal and maxillary sinuses.   Original Report Authenticated By: Resa Miner. MATTERN, M.D.      No diagnosis found.    MDM    The chart was scribed for me under my direct supervision.  I personally performed the history, physical, and medical decision making and all procedures in the evaluation of this patient.Seth Diego, MD 08/14/12 [id number]

## 2012-08-14 NOTE — ED Notes (Signed)
House supervisor at bedside to speak with pt and pt's family

## 2012-08-14 NOTE — ED Notes (Signed)
Family members state he has been getting progressively work over the past 3 months, states he has episodes of slurred speech and dizziness

## 2012-08-14 NOTE — ED Notes (Signed)
Discharge instructions reviewed with pt, questions answered. Pt verbalized understanding.  

## 2012-08-14 NOTE — ED Notes (Addendum)
Family concerned about pt's BP, Dr Roderic Palau discussed with the family that the pt is not in hypertensive crisis and does not need to be admitted.

## 2012-08-14 NOTE — ED Notes (Signed)
Pt denies SOB, dizziness or headache at present time.  Back from radiology. Denies pain.  No distress noted.

## 2012-08-14 NOTE — ED Notes (Signed)
Dr. Roderic Palau notified of pt's BP.

## 2012-08-14 NOTE — ED Notes (Addendum)
Reports "a little bit" of dizziness onset today.  Reports hx of high blood pressure, and states he is compliant with his meds.  Per daughter, she states that she and her family have noticed pt being dizzy and having slurred speech x several months.  Pt is alert, answers questions appropriately. Speech is clear.  Gait is steady. Placed on cardiac monitor.  EKG completed.

## 2012-08-26 ENCOUNTER — Ambulatory Visit (HOSPITAL_COMMUNITY)
Admission: RE | Admit: 2012-08-26 | Discharge: 2012-08-26 | Disposition: A | Discharge status: Home / Self Care | Payer: 59 | Source: Ambulatory Visit | Attending: Family Medicine | Admitting: Family Medicine

## 2012-08-26 DIAGNOSIS — IMO0001 Reserved for inherently not codable concepts without codable children: Secondary | ICD-10-CM | POA: Insufficient documentation

## 2012-08-26 DIAGNOSIS — Z8673 Personal history of transient ischemic attack (TIA), and cerebral infarction without residual deficits: Secondary | ICD-10-CM | POA: Insufficient documentation

## 2012-08-26 NOTE — Evaluation (Signed)
Physical Therapy Evaluation and Discharge  Patient Details  Name: Seth Neal MRN: [id number] Date of Birth: 1956/02/12  Today's Date: 08/26/2012 Time: 1345-1410 PT Time Calculation (min): 25 min Charges: 1 eval Visit#: 1  of 1   Re-eval:   Assessment Diagnosis: Mutliple Strokes Next MD Visit: Dr. [name] Prior Therapy: None  Authorization:    Authorization Time Period:    Authorization Visit#:   of     Past Medical History:  Past Medical History  Diagnosis Date  . Hypertension    Past Surgical History:  Past Surgical History  Procedure Date  . None   . Colonoscopy 05/17/2012    Procedure: COLONOSCOPY;  Surgeon: Danie Binder, MD;  Location: [facility];  Service: Endoscopy;  Laterality: N/A;  12:30    Subjective Symptoms/Limitations Symptoms: Pt reports that on 10/30 he was having dizziness episodes and proceeded to ED.  Then had a f/u w/Dr. [name] who referred him to PT for Luncnar mutli-strokes.  He reports that Dr. [name] has kept him out of work due to this fact.   Pain Assessment Currently in Pain?: No/denies  Precautions/Restrictions     Prior Functioning  Home Living Lives With: Family Prior Function Driving: Yes Vocation: Part time employment Vocation Requirements: Hotel manager for [employer]  Cognition/Observation Cognition Overall Cognitive Status: Appears within functional limits for tasks assessed Arousal/Alertness: Awake/alert Orientation Level: Oriented X4 Attention: Focused Memory: Appears intact Awareness: Appears intact Problem Solving: Appears intact Safety/Judgment: Appears intact Comments: Pt able to demonstrate multitasking abilities without difficulty.  Able to discuss family and work obligations without difficutly and in a logical fashion Observation/Other Assessments Observations: CN II-XII normal function  Sensation/Coordination/Flexibility/Functional Tests Coordination Gross Motor Movements are Fluid and Coordinated:  Yes Fine Motor Movements are Fluid and Coordinated: Yes Finger Nose Finger Test: smooth coordinated movements  Heel Shin Test: smooth coordinated movements Functional Tests Functional Tests: ABC: 100%  Assessment RUE Assessment RUE Assessment: Within Functional Limits LUE Assessment LUE Assessment: Within Functional Limits RLE Assessment RLE Assessment: Within Functional Limits LLE Assessment LLE Assessment: Within Functional Limits Cervical Assessment Cervical Assessment: Within Functional Limits Lumbar Assessment Lumbar Assessment: Within Functional Limits  Mobility/Balance  Ambulation/Gait Ambulation/Gait: Yes Ambulation/Gait Assistance: 7: Independent Gait Pattern: Within Functional Limits Static Standing Balance Static Standing - Comment/# of Minutes: each postion held for a max of 10 sec Single Leg Stance - Right Leg: 10  Single Leg Stance - Left Leg: 10  Tandem Stance - Right Leg: 10  Tandem Stance - Left Leg: 10  Rhomberg - Eyes Opened: 10  Rhomberg - Eyes Closed: 10  Dynamic Gait Index Level Surface: Normal Change in Gait Speed: Normal Gait with Horizontal Head Turns: Normal Gait with Vertical Head Turns: Normal Gait and Pivot Turn: Normal Step Over Obstacle: Normal Step Around Obstacles: Normal Steps: Normal Total Score: 24     Physical Therapy Assessment and Plan PT Assessment and Plan Clinical Impression Statement: Pt is referred to PT for multiple strokes.  After examination pt demonstrates high level mobility without difficulty, is able to multi-task without cueing, CN II-XII assessment is normal,.  At this time pt is able to demonstrate phyiscal demands of his job without difficulty.  He does not have any s/s demonstrating need for SLP evaluation as he denies difficulty with speech and swallowing, has appropriate re-call for past and future events.   PT Plan: D/C from PT    Goals    Problem List Patient Active Problem List  Diagnosis  . Change  in  bowel habits   Hugo Lybrand, PT 08/26/2012, 4:41 PM  Physician Documentation Your signature is required to indicate approval of the treatment plan as stated above.  Please sign and either send electronically or make a copy of this report for your files and return this physician signed original.   Please mark one 1.__approve of plan  2. ___approve of plan with the following conditions.   ______________________________                                                          _____________________ Physician Signature                                                                                                             Date

## 2012-10-22 ENCOUNTER — Other Ambulatory Visit: Payer: Self-pay | Admitting: Neurology

## 2012-10-22 DIAGNOSIS — I639 Cerebral infarction, unspecified: Secondary | ICD-10-CM

## 2012-10-22 DIAGNOSIS — R471 Dysarthria and anarthria: Secondary | ICD-10-CM

## 2012-10-25 ENCOUNTER — Ambulatory Visit (HOSPITAL_COMMUNITY)
Admission: RE | Admit: 2012-10-25 | Discharge: 2012-10-25 | Disposition: A | Discharge status: Home / Self Care | Payer: 59 | Source: Ambulatory Visit | Attending: Neurology | Admitting: Neurology

## 2012-10-25 DIAGNOSIS — R471 Dysarthria and anarthria: Secondary | ICD-10-CM | POA: Insufficient documentation

## 2012-10-25 DIAGNOSIS — I6350 Cerebral infarction due to unspecified occlusion or stenosis of unspecified cerebral artery: Secondary | ICD-10-CM | POA: Insufficient documentation

## 2012-10-25 DIAGNOSIS — I639 Cerebral infarction, unspecified: Secondary | ICD-10-CM

## 2012-11-21 ENCOUNTER — Other Ambulatory Visit: Payer: Self-pay

## 2012-11-21 DIAGNOSIS — G4700 Insomnia, unspecified: Secondary | ICD-10-CM

## 2012-12-02 ENCOUNTER — Other Ambulatory Visit: Payer: Self-pay

## 2012-12-02 DIAGNOSIS — G4700 Insomnia, unspecified: Secondary | ICD-10-CM

## 2012-12-04 ENCOUNTER — Ambulatory Visit: Payer: 59 | Attending: Neurology | Admitting: Sleep Medicine

## 2012-12-04 VITALS — Ht 65.0 in | Wt 160.0 lb

## 2012-12-04 DIAGNOSIS — G4700 Insomnia, unspecified: Secondary | ICD-10-CM

## 2012-12-04 DIAGNOSIS — G4733 Obstructive sleep apnea (adult) (pediatric): Secondary | ICD-10-CM | POA: Insufficient documentation

## 2012-12-06 NOTE — Procedures (Signed)
[name], MD     [email or web address]        NAMEBENSYN, Seth Neal               ACCOUNT NO.:  [id number]  MEDICAL RECORD NO.:  [id number]          PATIENT TYPE:  OUT  LOCATION:  SLEEP LAB                     FACILITY:  [facility]  PHYSICIAN:  [name], M.D. DATE OF BIRTH:  1956-02-07  DATE OF STUDY:  12/04/2012                           NOCTURNAL POLYSOMNOGRAM  REFERRING PHYSICIAN:  [name] A. Merlene Laughter, M.D.  INDICATION:  This is a 57 year old man, who presents with hypersomnia, fatigue, and snoring.  EPWORTH SLEEPINESS SCORE:  4.  BMI 21.  ARCHITECTURAL SUMMARY:  The total recording time is 405 minutes.  Sleep efficiency 54%.  Sleep latency is 32 minutes.  REM latency is 80.5 minutes.  Initially calculated by computer as 8.4 but recalculated manually.  Stage N1 14%, N2 46%, N3 13%, and REM sleep 28%.  RESPIRATORY SUMMARY:  Baseline oxygen saturation 98, lowest saturation 89.  Diagnostic AHI is 35 and RDI also 35.  LIMB MOVEMENT SUMMARY:  PLM index 0.  ELECTROCARDIOGRAM SUMMARY:  Average heart rate is 68 with no significant dysrhythmias observed.  IMPRESSION:  Moderate to severe obstructive sleep apnea syndrome.  RECOMMENDATION:  Formal CPAP titration study.  MEDICATIONS:  Simvastatin, enalapril, Flomax, Cialis.    [name], M.D.    KAD/MEDQ  D:  12/06/2012 10:31:29  T:  12/06/2012 10:46:18  Job:  [id number]

## 2013-05-14 DIAGNOSIS — Z0289 Encounter for other administrative examinations: Secondary | ICD-10-CM

## 2013-05-19 ENCOUNTER — Other Ambulatory Visit: Payer: Self-pay | Admitting: Family Medicine

## 2013-06-18 ENCOUNTER — Other Ambulatory Visit: Payer: Self-pay | Admitting: *Deleted

## 2013-06-18 MED ORDER — AMLODIPINE BESYLATE 5 MG PO TABS: ORAL_TABLET | ORAL | Status: DC

## 2013-07-08 ENCOUNTER — Encounter: Payer: Self-pay | Admitting: Family Medicine

## 2013-07-08 ENCOUNTER — Ambulatory Visit (INDEPENDENT_AMBULATORY_CARE_PROVIDER_SITE_OTHER): Payer: Self-pay | Admitting: Family Medicine

## 2013-07-08 VITALS — BP 148/92 | Ht 67.0 in | Wt 168.2 lb

## 2013-07-08 DIAGNOSIS — G4733 Obstructive sleep apnea (adult) (pediatric): Secondary | ICD-10-CM

## 2013-07-08 DIAGNOSIS — F0391 Unspecified dementia with behavioral disturbance: Secondary | ICD-10-CM

## 2013-07-08 DIAGNOSIS — R32 Unspecified urinary incontinence: Secondary | ICD-10-CM

## 2013-07-08 DIAGNOSIS — F03918 Unspecified dementia, unspecified severity, with other behavioral disturbance: Secondary | ICD-10-CM

## 2013-07-08 DIAGNOSIS — I6381 Other cerebral infarction due to occlusion or stenosis of small artery: Secondary | ICD-10-CM

## 2013-07-08 DIAGNOSIS — I10 Essential (primary) hypertension: Secondary | ICD-10-CM

## 2013-07-08 DIAGNOSIS — N400 Enlarged prostate without lower urinary tract symptoms: Secondary | ICD-10-CM

## 2013-07-08 DIAGNOSIS — I6350 Cerebral infarction due to unspecified occlusion or stenosis of unspecified cerebral artery: Secondary | ICD-10-CM

## 2013-07-08 MED ORDER — ENALAPRIL MALEATE 5 MG PO TABS: 5.0000 mg | ORAL_TABLET | Freq: Every day | ORAL | Status: DC

## 2013-07-08 MED ORDER — AMLODIPINE BESYLATE 5 MG PO TABS: ORAL_TABLET | ORAL | Status: DC

## 2013-07-08 NOTE — Progress Notes (Signed)
  Subjective:    Patient ID: Seth Neal, male    DOB: 06-07-1956, 57 y.o.   MRN: [id number]  HPI Patient arrives for follow up on blood pressure. Claims compliance with medication. Having difficulty with accidents and urinating on himself intermittently.  Had a disability assessment recently which showed profound ongoing challenges with mental faculties.   Taking bp meds.  No insurance at this time  No new evidence of stroke or similar difficulty Patient's wife reports however he continues to have considerable spells of confusion and mental fogginess  Review of Systems No chest pain no back pain no abdominal pain    Objective:   Physical Exam  Alert no acute distress. Blood pressure 140/90 on repeat HEENT normal. Lungs clear. Heart regular rate and rhythm. Oriented x2. Pleasant no acute distress.  Some shakiness and gait.    Assessment & Plan:  Impression hypertension #2 status post stroke. 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 Patient asks once again he can drive. I have advised patient he will never be able to drive again #3 urinary incontinence likely both aggravated by the HCTZ along with mental responsiveness to bladder urges is discussed with family. Plan stop HCTZ. Start enalapril. Do not drive. Exercise encourage. Diet discussed. Recheck as scheduled. WSL

## 2013-07-09 DIAGNOSIS — N400 Enlarged prostate without lower urinary tract symptoms: Secondary | ICD-10-CM | POA: Insufficient documentation

## 2013-07-09 DIAGNOSIS — G4733 Obstructive sleep apnea (adult) (pediatric): Secondary | ICD-10-CM | POA: Insufficient documentation

## 2013-07-09 DIAGNOSIS — I10 Essential (primary) hypertension: Secondary | ICD-10-CM | POA: Insufficient documentation

## 2013-07-09 DIAGNOSIS — F0391 Unspecified dementia with behavioral disturbance: Secondary | ICD-10-CM | POA: Insufficient documentation

## 2013-07-09 DIAGNOSIS — I6381 Other cerebral infarction due to occlusion or stenosis of small artery: Secondary | ICD-10-CM | POA: Insufficient documentation

## 2013-07-09 DIAGNOSIS — R32 Unspecified urinary incontinence: Secondary | ICD-10-CM | POA: Insufficient documentation

## 2013-10-01 ENCOUNTER — Encounter: Payer: Self-pay | Admitting: Family Medicine

## 2013-10-01 ENCOUNTER — Ambulatory Visit (INDEPENDENT_AMBULATORY_CARE_PROVIDER_SITE_OTHER): Payer: Self-pay | Admitting: Family Medicine

## 2013-10-01 VITALS — BP 152/96 | Ht 67.0 in | Wt 183.0 lb

## 2013-10-01 DIAGNOSIS — F0391 Unspecified dementia with behavioral disturbance: Secondary | ICD-10-CM

## 2013-10-01 DIAGNOSIS — I6381 Other cerebral infarction due to occlusion or stenosis of small artery: Secondary | ICD-10-CM

## 2013-10-01 DIAGNOSIS — G4733 Obstructive sleep apnea (adult) (pediatric): Secondary | ICD-10-CM

## 2013-10-01 DIAGNOSIS — I10 Essential (primary) hypertension: Secondary | ICD-10-CM

## 2013-10-01 DIAGNOSIS — F03918 Unspecified dementia, unspecified severity, with other behavioral disturbance: Secondary | ICD-10-CM

## 2013-10-01 DIAGNOSIS — I6350 Cerebral infarction due to unspecified occlusion or stenosis of unspecified cerebral artery: Secondary | ICD-10-CM

## 2013-10-01 MED ORDER — ENALAPRIL MALEATE 10 MG PO TABS: 10.0000 mg | ORAL_TABLET | Freq: Every day | ORAL | Status: DC

## 2013-10-01 NOTE — Patient Instructions (Signed)
plz cut down your smoking  Increase your enalapril to ten mg daily. You can take two of the five mg until you get the new prescription

## 2013-10-01 NOTE — Progress Notes (Signed)
   Subjective:    Patient ID: Seth Neal, male    DOB: 1956/03/01, 57 y.o.   MRN: [id number]  HPI Patient arrives for a follow up on blood pressure.  Blood Pressure still running high on current meds. Claims compliance with medication.  Still unsteady. Still having periods of confusion per family. History of multi-infarct stroke.  Still having accidents with the urinating, trying to get to the b r still having problems with incont  Blood pressure Numb are still high,   Review of Systems No headache no chest pain no back pain no abdominal pain ROS otherwise negative    Objective:   Physical Exam Alert oriented somewhat unsteady gait. Lungs clear. Heart regular in rhythm. H&T normal. Blood pressure 148/92 on repeat.       Assessment & Plan:  Impression 1 hypertension suboptimal control. #2 status post stroke discussed #3 chronic smoker encouraged to stop. #4 incontinence ongoing likely in part due to stroke. #5 driving patient asks once again. Once again I've advised him he will never be able to drive again unfortunately. Plan increase enalapril to 10 mg daily. Maintain other meds. Diet exercise discussed. Stop smoking. Recheck in several months. WSL

## 2013-10-07 ENCOUNTER — Ambulatory Visit: Payer: Self-pay | Admitting: Family Medicine

## 2013-12-31 ENCOUNTER — Ambulatory Visit: Payer: Self-pay | Admitting: Family Medicine

## 2014-02-04 ENCOUNTER — Ambulatory Visit (INDEPENDENT_AMBULATORY_CARE_PROVIDER_SITE_OTHER): Payer: BC Managed Care – PPO | Admitting: Family Medicine

## 2014-02-04 ENCOUNTER — Encounter: Payer: Self-pay | Admitting: Family Medicine

## 2014-02-04 VITALS — BP 158/98 | Ht 67.0 in | Wt 186.0 lb

## 2014-02-04 DIAGNOSIS — F03918 Unspecified dementia, unspecified severity, with other behavioral disturbance: Secondary | ICD-10-CM

## 2014-02-04 DIAGNOSIS — Z13220 Encounter for screening for lipoid disorders: Secondary | ICD-10-CM

## 2014-02-04 DIAGNOSIS — I10 Essential (primary) hypertension: Secondary | ICD-10-CM

## 2014-02-04 DIAGNOSIS — F0391 Unspecified dementia with behavioral disturbance: Secondary | ICD-10-CM

## 2014-02-04 DIAGNOSIS — Z79899 Other long term (current) drug therapy: Secondary | ICD-10-CM

## 2014-02-04 DIAGNOSIS — I6350 Cerebral infarction due to unspecified occlusion or stenosis of unspecified cerebral artery: Secondary | ICD-10-CM

## 2014-02-04 DIAGNOSIS — G4733 Obstructive sleep apnea (adult) (pediatric): Secondary | ICD-10-CM

## 2014-02-04 DIAGNOSIS — N400 Enlarged prostate without lower urinary tract symptoms: Secondary | ICD-10-CM

## 2014-02-04 DIAGNOSIS — Z125 Encounter for screening for malignant neoplasm of prostate: Secondary | ICD-10-CM

## 2014-02-04 DIAGNOSIS — I6381 Other cerebral infarction due to occlusion or stenosis of small artery: Secondary | ICD-10-CM

## 2014-02-04 MED ORDER — ENALAPRIL MALEATE 20 MG PO TABS: 20.0000 mg | ORAL_TABLET | Freq: Every day | ORAL | Status: DC

## 2014-02-04 MED ORDER — AMOXICILLIN-POT CLAVULANATE 875-125 MG PO TABS: 1.0000 | ORAL_TABLET | Freq: Two times a day (BID) | ORAL | Status: DC

## 2014-02-04 NOTE — Progress Notes (Signed)
   Subjective:    Patient ID: Seth Neal, male    DOB: [birth year], 58 y.o.   MRN: [id number]   HPI Patient is here today for a 6 month check up.  He needs a refill on his meds.  Pt has been congested for 2 weeks now. Cough is productive of yellowish phlegm intermittently.  Patient claims compliance with his medication. No obvious side effects. Unfortunately still smoking.  Family does not check blood pressure elsewhere.  No recent symptoms of stroke. No symptoms of TIA.  Smoking around a pack a day  Cough productive at time  Exercise ok but not great  Patient asked once again when he may be able to drive.  Review of Systems No headache no chest pain no back pain no abdominal pain no change in bowel habits no blood in stool ROS otherwise negative    Objective:   Physical Exam Alert no apparent distress. HEENT moderate his congestion frontal tenderness pharynx normal neck supple lungs diminished breath sounds no wheezes heart rare rhythm blood pressure repeat high 144/90 on repeat.       Assessment & Plan:  Impression 1 hypertension suboptimal control. #2 status post stroke clinically still having significant memory issues and balance issues. #3 smoking patient encouraged to stop ASAP #4 stable mild dementia secondary to #2 #5 sinusitis/bronchitis plan antibiotics prescribed. Symptomatic care discussed. Increase enalapril to 20 mg daily rationale discussed. Appropriate blood work. Recheck in several months. Diet exercise discussed. WSL

## 2014-02-16 ENCOUNTER — Telehealth: Payer: Self-pay | Admitting: Family Medicine

## 2014-02-16 MED ORDER — ENALAPRIL MALEATE 20 MG PO TABS: 20.0000 mg | ORAL_TABLET | Freq: Every day | ORAL | Status: DC

## 2014-02-16 MED ORDER — AMOXICILLIN-POT CLAVULANATE 875-125 MG PO TABS: 1.0000 | ORAL_TABLET | Freq: Two times a day (BID) | ORAL | Status: DC

## 2014-02-16 NOTE — Telephone Encounter (Signed)
Pts spouse states that his meds for 4/22 that were called into  [facility] were supposed to be called into [facility]. And that [facility] is stating they never got the request for  Both only the antibiotic request  Pt did not fill them or pick them up at [facility]   Pt also told wife he didn't know what the antibiotic would  Have been for. May need to clarify with spouse why

## 2014-02-16 NOTE — Telephone Encounter (Signed)
Notified patient's wife that medications were sent to [facility]. Wife verbalized understanding.

## 2014-04-02 ENCOUNTER — Emergency Department (HOSPITAL_COMMUNITY): Payer: BC Managed Care – PPO

## 2014-04-02 ENCOUNTER — Emergency Department (HOSPITAL_COMMUNITY)
Admission: EM | Admit: 2014-04-02 | Discharge: 2014-04-03 | Disposition: A | Discharge status: Home / Self Care | Payer: BC Managed Care – PPO | Attending: Emergency Medicine | Admitting: Emergency Medicine

## 2014-04-02 ENCOUNTER — Encounter (HOSPITAL_COMMUNITY): Payer: Self-pay | Admitting: Emergency Medicine

## 2014-04-02 DIAGNOSIS — I10 Essential (primary) hypertension: Secondary | ICD-10-CM

## 2014-04-02 DIAGNOSIS — R519 Headache, unspecified: Secondary | ICD-10-CM

## 2014-04-02 DIAGNOSIS — R51 Headache: Secondary | ICD-10-CM | POA: Insufficient documentation

## 2014-04-02 DIAGNOSIS — F17200 Nicotine dependence, unspecified, uncomplicated: Secondary | ICD-10-CM | POA: Insufficient documentation

## 2014-04-02 DIAGNOSIS — Z79899 Other long term (current) drug therapy: Secondary | ICD-10-CM | POA: Insufficient documentation

## 2014-04-02 DIAGNOSIS — Z8673 Personal history of transient ischemic attack (TIA), and cerebral infarction without residual deficits: Secondary | ICD-10-CM | POA: Insufficient documentation

## 2014-04-02 HISTORY — DX: Cerebral infarction, unspecified: I63.9

## 2014-04-02 LAB — COMPREHENSIVE METABOLIC PANEL
ALT: 13 U/L (ref 0–53)
AST: 18 U/L (ref 0–37)
Albumin: 3.7 g/dL (ref 3.5–5.2)
Alkaline Phosphatase: 115 U/L (ref 39–117)
BUN: 11 mg/dL (ref 6–23)
CO2: 27 mEq/L (ref 19–32)
Calcium: 9.4 mg/dL (ref 8.4–10.5)
Chloride: 100 mEq/L (ref 96–112)
Creatinine, Ser: 1.15 mg/dL (ref 0.50–1.35)
GFR calc Af Amer: 79 mL/min — ABNORMAL LOW (ref >90)
GFR calc non Af Amer: 68 mL/min — ABNORMAL LOW (ref >90)
GLUCOSE: 138 mg/dL — AB (ref 70–99)
Potassium: 3.5 mEq/L — ABNORMAL LOW (ref 3.7–5.3)
SODIUM: 141 meq/L (ref 137–147)
TOTAL PROTEIN: 7.9 g/dL (ref 6.0–8.3)
Total Bilirubin: 0.3 mg/dL (ref 0.3–1.2)

## 2014-04-02 LAB — CBC WITH DIFFERENTIAL/PLATELET
BASOS ABS: 0 10*3/uL (ref 0.0–0.1)
Basophils Relative: 0 % (ref 0–1)
EOS ABS: 0.2 10*3/uL (ref 0.0–0.7)
Eosinophils Relative: 2 % (ref 0–5)
HCT: 41.7 % (ref 39.0–52.0)
Hemoglobin: 14.7 g/dL (ref 13.0–17.0)
LYMPHS PCT: 21 % (ref 12–46)
Lymphs Abs: 2.6 10*3/uL (ref 0.7–4.0)
MCH: 31.3 pg (ref 26.0–34.0)
MCHC: 35.3 g/dL (ref 30.0–36.0)
MCV: 88.9 fL (ref 78.0–100.0)
Monocytes Absolute: 0.8 10*3/uL (ref 0.1–1.0)
Monocytes Relative: 6 % (ref 3–12)
NEUTROS PCT: 71 % (ref 43–77)
Neutro Abs: 9 10*3/uL — ABNORMAL HIGH (ref 1.7–7.7)
PLATELETS: 285 10*3/uL (ref 150–400)
RBC: 4.69 MIL/uL (ref 4.22–5.81)
RDW: 14.7 % (ref 11.5–15.5)
WBC: 12.6 10*3/uL — AB (ref 4.0–10.5)

## 2014-04-02 MED ORDER — METOCLOPRAMIDE HCL 5 MG/ML IJ SOLN
10.0000 mg | Freq: Once | INTRAMUSCULAR | Status: AC
  Administered 2014-04-02: 10 mg via INTRAVENOUS
  Filled 2014-04-02: qty 2

## 2014-04-02 MED ORDER — CLONIDINE HCL 0.1 MG PO TABS
0.1000 mg | ORAL_TABLET | Freq: Once | ORAL | Status: AC
Start: 2014-04-02 — End: 2014-04-02
  Administered 2014-04-02: 0.1 mg via ORAL
  Filled 2014-04-02: qty 1

## 2014-04-02 MED ORDER — DIPHENHYDRAMINE HCL 50 MG/ML IJ SOLN
25.0000 mg | Freq: Once | INTRAMUSCULAR | Status: AC
  Administered 2014-04-02: 25 mg via INTRAVENOUS
  Filled 2014-04-02: qty 1

## 2014-04-02 NOTE — ED Provider Notes (Signed)
CSN: [id number]     Arrival date & time 04/02/14  2018 History   First MD Initiated Contact with Patient 04/02/14 2118 This chart was scribed for Janice Norrie, MD by Anastasia Pall, ED Scribe. This patient was seen in room [facility] and the patient's care was started at 10:15 PM.    Chief Complaint  Patient presents with  . Headache   (Consider location/radiation/quality/duration/timing/severity/associated sxs/prior Treatment) The history is provided by the patient and the spouse. No language interpreter was used.   HPI Comments: Seth Neal is a 58 y.o. male who presents to the Emergency Department with a history of stroke 1 year ago complaining of a headache on the right side onset 5 AM this morning when he woke up. The patient usually gets up at the same time everyday. The sharp pain has been constant but waxing and waning throughout the day, rates pain 7/10, having eased a little over time. Nothing makes pain better or worse including photophobia. He has tried no medications for the headache today. Patient denies nausea, vomiting, visual disturbance, numbness, tingling. The patient's wife states that he had a stroke over a year ago and that he has had gradually slowed speech. Patient reports history of headaches but has not shared with family, reports this headache was worse than it has been before. The patient takes blood pressure meds daily. He is not sure if he took his medications this morning. Patient smokes one PPD, does not drink.  His wife reports that his last visit with Dr. [name] was about two months ago and that his BP was somewhat high and they increased the dosage of his medication.    PCP - [name], MD  Past Medical History  Diagnosis Date  . Hypertension   . Stroke    Past Surgical History  Procedure Laterality Date  . None    . Colonoscopy  05/17/2012    Procedure: COLONOSCOPY;  Surgeon: Danie Binder, MD;  Location: [facility];  Service: Endoscopy;  Laterality:  N/A;  12:30   Family History  Problem Relation Age of Onset  . Colon cancer Neg Hx    History  Substance Use Topics  . Smoking status: Current Every Day Smoker -- 1.00 packs/day    Types: Cigarettes  . Smokeless tobacco: Not on file  . Alcohol Use: Yes     Comment: occ  lives at home Lives with spouse  Review of Systems  Constitutional: Negative for fever.  Eyes: Negative for photophobia and visual disturbance.  Gastrointestinal: Negative for nausea and vomiting.  Neurological: Positive for facial asymmetry (left side) and headaches. Negative for numbness.  All other systems reviewed and are negative.   Allergies  Bee venom  Home Medications   Prior to Admission medications   Medication Sig Start Date End Date Taking? Authorizing Provider  enalapril (VASOTEC) 20 MG tablet Take 1 tablet (20 mg total) by mouth daily. 02/16/14  Yes [name], MD   BP 200/120  Pulse 82  Temp(Src) 98.1 F (36.7 C) (Oral)  Resp 20  Ht 5\' 7"  (1.702 m)  Wt 185 lb 1 oz (83.944 kg)  BMI 28.98 kg/m2  SpO2 99%  Vital signs normal except hypertension  Physical Exam  Nursing note and vitals reviewed. Constitutional: He is oriented to person, place, and time. He appears well-developed and well-nourished.  Non-toxic appearance. He does not appear ill. No distress.  HENT:  Head: Normocephalic and atraumatic.  Right Ear: External ear normal.  Left Ear: External ear normal.  Nose: Nose normal. No mucosal edema or rhinorrhea.  Mouth/Throat: Oropharynx is clear and moist and mucous membranes are normal. No dental abscesses or uvula swelling.  Eyes: Conjunctivae and EOM are normal. Pupils are equal, round, and reactive to light.  Neck: Normal range of motion and full passive range of motion without pain. Neck supple.  Cardiovascular: Normal rate, regular rhythm and normal heart sounds.  Exam reveals no gallop and no friction rub.   No murmur heard. Pulmonary/Chest: Effort normal and breath  sounds normal. No respiratory distress. He has no wheezes. He has no rhonchi. He has no rales. He exhibits no tenderness and no crepitus.  Abdominal: Soft. Normal appearance and bowel sounds are normal. He exhibits no distension. There is no tenderness. There is no rebound and no guarding.  Musculoskeletal: Normal range of motion. He exhibits no edema and no tenderness.  Moves all extremities well.   Neurological: He is alert and oriented to person, place, and time. He has normal strength. No cranial nerve deficit.  Equal grips on both sides  No pronator drift  Slow speech, slurred, wife states that's been since stroke ? Has mild left facial droop, wife is unsure if it is old or new  Skin: Skin is warm, dry and intact. No rash noted. No erythema. No pallor.  Psychiatric: He has a normal mood and affect. His mood appears not anxious. His speech is delayed and slurred. He is slowed.    ED Course  Procedures (including critical care time)  Medications  metoCLOPramide (REGLAN) injection 10 mg (10 mg Intravenous Given 04/02/14 2239)  diphenhydrAMINE (BENADRYL) injection 25 mg (25 mg Intravenous Given 04/02/14 2239)  cloNIDine (CATAPRES) tablet 0.1 mg (0.1 mg Oral Given 04/02/14 2319)    DIAGNOSTIC STUDIES: Oxygen Saturation is 99% on room air, normal by my interpretation.    COORDINATION OF CARE: 12:27 AM-Discussed treatment plan which includes CT scan and blood work with pt at bedside and pt agreed to plan.   Recheck at discharge, pt states his headache is gone. His BP is 196/101. He feels ready to be discharged.   Labs Review Results for orders placed during the hospital encounter of 04/02/14  CBC WITH DIFFERENTIAL      Result Value Ref Range   WBC 12.6 (*) 4.0 - 10.5 K/uL   RBC 4.69  4.22 - 5.81 MIL/uL   Hemoglobin 14.7  13.0 - 17.0 g/dL   HCT 41.7  39.0 - 52.0 %   MCV 88.9  78.0 - 100.0 fL   MCH 31.3  26.0 - 34.0 pg   MCHC 35.3  30.0 - 36.0 g/dL   RDW 14.7  11.5 - 15.5 %    Platelets 285  150 - 400 K/uL   Neutrophils Relative % 71  43 - 77 %   Neutro Abs 9.0 (*) 1.7 - 7.7 K/uL   Lymphocytes Relative 21  12 - 46 %   Lymphs Abs 2.6  0.7 - 4.0 K/uL   Monocytes Relative 6  3 - 12 %   Monocytes Absolute 0.8  0.1 - 1.0 K/uL   Eosinophils Relative 2  0 - 5 %   Eosinophils Absolute 0.2  0.0 - 0.7 K/uL   Basophils Relative 0  0 - 1 %   Basophils Absolute 0.0  0.0 - 0.1 K/uL  COMPREHENSIVE METABOLIC PANEL      Result Value Ref Range   Sodium 141  137 - 147 mEq/L  Potassium 3.5 (*) 3.7 - 5.3 mEq/L   Chloride 100  96 - 112 mEq/L   CO2 27  19 - 32 mEq/L   Glucose, Bld 138 (*) 70 - 99 mg/dL   BUN 11  6 - 23 mg/dL   Creatinine, Ser 1.15  0.50 - 1.35 mg/dL   Calcium 9.4  8.4 - 10.5 mg/dL   Total Protein 7.9  6.0 - 8.3 g/dL   Albumin 3.7  3.5 - 5.2 g/dL   AST 18  0 - 37 U/L   ALT 13  0 - 53 U/L   Alkaline Phosphatase 115  39 - 117 U/L   Total Bilirubin 0.3  0.3 - 1.2 mg/dL   GFR calc non Af Amer 68 (*) >90 mL/min   GFR calc Af Amer 79 (*) >90 mL/min   Laboratory interpretation all normal except mild hypokalemia, leukocytosis   Ct Head Wo Contrast  04/02/2014   CLINICAL DATA:  HEADACHE with dizziness, history of stroke, ? left sided droop  EXAM: CT HEAD WITHOUT CONTRAST  TECHNIQUE: Contiguous axial images were obtained from the base of the skull through the vertex without intravenous contrast.  COMPARISON:  Prior CT from 08/14/2012  FINDINGS: Diffuse prominence of the CSF containing spaces is compatible with generalized cerebral atrophy. Scattered and confluent hypodensity within the periventricular and deep white matter is most consistent with advanced chronic microvascular ischemic disease. Remote lacunar infarcts again noted within the basal ganglia bilaterally. Small remote lacunar infarct again noted within the left pons.  There is no acute intracranial hemorrhage or infarct. No mass lesion or midline shift. Gray-white matter differentiation is well maintained.  Ventricles are normal in size without evidence of hydrocephalus. CSF containing spaces are within normal limits. No extra-axial fluid collection.  The calvarium is intact.  Orbital soft tissues are within normal limits.  Mild mucosal thickening present within the visualized left maxillary sinus. There is scattered opacity within the left frontoethmoidal recess. No mastoid effusion.  Scalp soft tissues are unremarkable.  IMPRESSION: 1. No acute intracranial process. 2. Remote lacunar infarcts within the basal ganglia bilaterally and pons, unchanged. 3. Generalized cerebral atrophy with advanced small vessel ischemic disease. 4. Left frontal and maxillary sinus disease.   Electronically Signed   By: Jeannine Boga M.D.   On: 04/02/2014 23:40    EKG Interpretation None      MDM   Final diagnoses:  Headache, unspecified headache type  Essential hypertension    Plan discharge  Rolland Porter, MD, FACEP   I personally performed the services described in this documentation, which was scribed in my presence. The recorded information has been reviewed and considered.  Rolland Porter, MD, Abram Sander    Janice Norrie, MD 04/03/14 [phone]

## 2014-04-02 NOTE — ED Notes (Signed)
Patient states that he is having a headache. Every now and again he has a headache.

## 2014-04-02 NOTE — ED Notes (Signed)
Patient alert and oriented x4. Patient sts a headache to the right side of head. Family member at bedside states that patient had CVA last year and that patient is at baseline from previous stroke.

## 2014-04-02 NOTE — ED Notes (Signed)
Family states that he has a history of mini strokes.

## 2014-04-03 NOTE — Discharge Instructions (Signed)
Go home and check your medications, if you didn't take your blood pressure medications today, take them when you get home. Have your doctor recheck your blood pressure again soon. Return to the ED if you feel worse.

## 2014-04-04 LAB — HEPATIC FUNCTION PANEL
ALK PHOS: 99 U/L (ref 39–117)
ALT: 11 U/L (ref 0–53)
AST: 16 U/L (ref 0–37)
Albumin: 3.9 g/dL (ref 3.5–5.2)
BILIRUBIN INDIRECT: 0.4 mg/dL (ref 0.2–1.2)
BILIRUBIN TOTAL: 0.5 mg/dL (ref 0.2–1.2)
Bilirubin, Direct: 0.1 mg/dL (ref 0.0–0.3)
TOTAL PROTEIN: 7.2 g/dL (ref 6.0–8.3)

## 2014-04-04 LAB — LIPID PANEL
Cholesterol: 194 mg/dL (ref 0–200)
HDL: 32 mg/dL — ABNORMAL LOW (ref >39)
LDL CALC: 137 mg/dL — AB (ref 0–99)
TRIGLYCERIDES: 125 mg/dL (ref <150)
Total CHOL/HDL Ratio: 6.1 Ratio
VLDL: 25 mg/dL (ref 0–40)

## 2014-04-04 LAB — BASIC METABOLIC PANEL
BUN: 13 mg/dL (ref 6–23)
CHLORIDE: 102 meq/L (ref 96–112)
CO2: 29 meq/L (ref 19–32)
CREATININE: 1.23 mg/dL (ref 0.50–1.35)
Calcium: 9.5 mg/dL (ref 8.4–10.5)
Glucose, Bld: 94 mg/dL (ref 70–99)
Potassium: 3.8 mEq/L (ref 3.5–5.3)
Sodium: 138 mEq/L (ref 135–145)

## 2014-04-06 LAB — PSA: PSA: 2.09 ng/mL (ref <=4.00)

## 2014-04-16 ENCOUNTER — Encounter (HOSPITAL_COMMUNITY): Payer: Self-pay | Admitting: Emergency Medicine

## 2014-04-16 ENCOUNTER — Emergency Department (HOSPITAL_COMMUNITY): Payer: BC Managed Care – PPO

## 2014-04-16 ENCOUNTER — Emergency Department (HOSPITAL_COMMUNITY)
Admission: EM | Admit: 2014-04-16 | Discharge: 2014-04-16 | Disposition: A | Discharge status: Home / Self Care | Payer: BC Managed Care – PPO | Attending: Emergency Medicine | Admitting: Emergency Medicine

## 2014-04-16 DIAGNOSIS — F17200 Nicotine dependence, unspecified, uncomplicated: Secondary | ICD-10-CM | POA: Insufficient documentation

## 2014-04-16 DIAGNOSIS — R55 Syncope and collapse: Secondary | ICD-10-CM | POA: Insufficient documentation

## 2014-04-16 DIAGNOSIS — I10 Essential (primary) hypertension: Secondary | ICD-10-CM | POA: Insufficient documentation

## 2014-04-16 DIAGNOSIS — Z8673 Personal history of transient ischemic attack (TIA), and cerebral infarction without residual deficits: Secondary | ICD-10-CM | POA: Insufficient documentation

## 2014-04-16 DIAGNOSIS — Z79899 Other long term (current) drug therapy: Secondary | ICD-10-CM | POA: Insufficient documentation

## 2014-04-16 LAB — COMPREHENSIVE METABOLIC PANEL
ALBUMIN: 3.9 g/dL (ref 3.5–5.2)
ALT: 7 U/L (ref 0–53)
ANION GAP: 15 (ref 5–15)
AST: 25 U/L (ref 0–37)
Alkaline Phosphatase: 126 U/L — ABNORMAL HIGH (ref 39–117)
BILIRUBIN TOTAL: 0.4 mg/dL (ref 0.3–1.2)
BUN: 9 mg/dL (ref 6–23)
CALCIUM: 9.7 mg/dL (ref 8.4–10.5)
CO2: 25 mEq/L (ref 19–32)
CREATININE: 1.12 mg/dL (ref 0.50–1.35)
Chloride: 97 mEq/L (ref 96–112)
GFR calc Af Amer: 81 mL/min — ABNORMAL LOW (ref >90)
GFR calc non Af Amer: 70 mL/min — ABNORMAL LOW (ref >90)
Glucose, Bld: 169 mg/dL — ABNORMAL HIGH (ref 70–99)
Potassium: 3.7 mEq/L (ref 3.7–5.3)
Sodium: 137 mEq/L (ref 137–147)
Total Protein: 8.2 g/dL (ref 6.0–8.3)

## 2014-04-16 LAB — CBC WITH DIFFERENTIAL/PLATELET
BASOS ABS: 0 10*3/uL (ref 0.0–0.1)
BASOS PCT: 0 % (ref 0–1)
EOS ABS: 0 10*3/uL (ref 0.0–0.7)
EOS PCT: 0 % (ref 0–5)
HEMATOCRIT: 43.2 % (ref 39.0–52.0)
HEMOGLOBIN: 14.7 g/dL (ref 13.0–17.0)
Lymphocytes Relative: 11 % — ABNORMAL LOW (ref 12–46)
Lymphs Abs: 1.6 10*3/uL (ref 0.7–4.0)
MCH: 30.4 pg (ref 26.0–34.0)
MCHC: 34 g/dL (ref 30.0–36.0)
MCV: 89.4 fL (ref 78.0–100.0)
MONO ABS: 0.5 10*3/uL (ref 0.1–1.0)
MONOS PCT: 3 % (ref 3–12)
Neutro Abs: 12.4 10*3/uL — ABNORMAL HIGH (ref 1.7–7.7)
Neutrophils Relative %: 86 % — ABNORMAL HIGH (ref 43–77)
Platelets: 289 10*3/uL (ref 150–400)
RBC: 4.83 MIL/uL (ref 4.22–5.81)
RDW: 14.4 % (ref 11.5–15.5)
WBC: 14.5 10*3/uL — ABNORMAL HIGH (ref 4.0–10.5)

## 2014-04-16 LAB — TROPONIN I: Troponin I: <0.3 ng/mL (ref <0.30)

## 2014-04-16 MED ORDER — ONDANSETRON HCL 4 MG/2ML IJ SOLN
4.0000 mg | Freq: Once | INTRAMUSCULAR | Status: AC
  Administered 2014-04-16: 4 mg via INTRAVENOUS
  Filled 2014-04-16: qty 2

## 2014-04-16 MED ORDER — LABETALOL HCL 5 MG/ML IV SOLN
10.0000 mg | Freq: Once | INTRAVENOUS | Status: AC
  Administered 2014-04-16: 10 mg via INTRAVENOUS
  Filled 2014-04-16: qty 4

## 2014-04-16 NOTE — ED Notes (Signed)
EDP aware of pts BP. He will check on pt very shortly.

## 2014-04-16 NOTE — ED Provider Notes (Signed)
CSN: [id number]     Arrival date & time 04/16/14  0150 History   First MD Initiated Contact with Patient 04/16/14 0207     Chief Complaint  Patient presents with  . Altered Mental Status    Level V caveat: Altered mental status/prior stroke  HPI Wife reports that the patient had a stroke approximately a year ago and since then has had issues with memory and speech issues.  They sleep in different bedrooms.  She awoke to the sound of the thumb and found her husband lying on the floor in the hallway.  Apparently had vomited on himself.  Is not uncommon for him to have urinary and fecal incontinence.  He's been his normal state of health over the past several days.  Patient has no complaints at this time although has somewhat confused speech which his wife reports is baseline for him.  He was found by EMS to be hypertensive in route.  Family reports the patient is compliant with his medications.  He has a history of hypertension.  He still smokes cigarettes.  Patient denies headache at this time.  No chest pain or shortness of breath.   Past Medical History  Diagnosis Date  . Hypertension   . Stroke    Past Surgical History  Procedure Laterality Date  . None    . Colonoscopy  05/17/2012    Procedure: COLONOSCOPY;  Surgeon: Danie Binder, MD;  Location: [facility];  Service: Endoscopy;  Laterality: N/A;  12:30   Family History  Problem Relation Age of Onset  . Colon cancer Neg Hx    History  Substance Use Topics  . Smoking status: Current Every Day Smoker -- 1.00 packs/day    Types: Cigarettes  . Smokeless tobacco: Not on file  . Alcohol Use: Yes     Comment: occ    Review of Systems  Unable to perform ROS: Mental status change      Allergies  Bee venom  Home Medications   Prior to Admission medications   Medication Sig Start Date End Date Taking? Authorizing Provider  enalapril (VASOTEC) 20 MG tablet Take 1 tablet (20 mg total) by mouth daily. 02/16/14   [name], MD   BP 179/123  Pulse 85  Temp(Src) 98.3 F (36.8 C) (Oral)  Resp 17  Ht 5\' 3"  (1.6 m)  Wt 209 lb (94.802 kg)  BMI 37.03 kg/m2  SpO2 99% Physical Exam  Nursing note and vitals reviewed. Constitutional: He is oriented to person, place, and time. He appears well-developed and well-nourished.  HENT:  Head: Normocephalic and atraumatic.  Eyes: EOM are normal. Pupils are equal, round, and reactive to light.  Neck: Normal range of motion.  Cardiovascular: Normal rate, regular rhythm, normal heart sounds and intact distal pulses.   Pulmonary/Chest: Effort normal and breath sounds normal. No respiratory distress.  Abdominal: Soft. He exhibits no distension. There is no tenderness.  Musculoskeletal: Normal range of motion.  Neurological: He is alert and oriented to person, place, and time.  5/5 strength in major muscle groups of  bilateral upper and lower extremities.  Mild dysarthric speech with some disorganization (baseline per spouse). No facial asymetry.   Skin: Skin is warm and dry.  Psychiatric: He has a normal mood and affect. Judgment normal.    ED Course  Procedures (including critical care time) Labs Review Labs Reviewed  CBC WITH DIFFERENTIAL - Abnormal; Notable for the following:    WBC 14.5 (*)  Neutrophils Relative % 86 (*)    Neutro Abs 12.4 (*)    Lymphocytes Relative 11 (*)    All other components within normal limits  COMPREHENSIVE METABOLIC PANEL - Abnormal; Notable for the following:    Glucose, Bld 169 (*)    Alkaline Phosphatase 126 (*)    GFR calc non Af Amer 70 (*)    GFR calc Af Amer 81 (*)    All other components within normal limits  TROPONIN I    Imaging Review Ct Head Wo Contrast  04/16/2014   CLINICAL DATA:  Altered mental status. Found down. History of stroke.  EXAM: CT HEAD WITHOUT CONTRAST  TECHNIQUE: Contiguous axial images were obtained from the base of the skull through the vertex without intravenous contrast.  COMPARISON:  Prior  CT from 04/02/2014.  FINDINGS: Generalized cerebral atrophy with advanced chronic microvascular ischemic disease is present, stable from prior. Remote scratched lacunar infarcts involving the basal ganglia bilaterally also stable.  There is no acute intracranial hemorrhage or infarct. No mass lesion or midline shift. Gray-white matter differentiation is well maintained. Ventricles are normal in size without evidence of hydrocephalus. No extra-axial fluid collection.  The calvarium is intact.  Orbital soft tissues are within normal limits.  The paranasal sinuses and mastoid air cells are well pneumatized and free of fluid.  Scalp soft tissues are unremarkable.  IMPRESSION: 1. No acute intracranial abnormality. 2. Stable remote lacunar infarcts involving the basal ganglia bilaterally. 3. Atrophy with advanced chronic microvascular ischemic disease.   Electronically Signed   By: Jeannine Boga M.D.   On: 04/16/2014 03:02  I personally reviewed the imaging tests through PACS system I reviewed available ER/hospitalization records through the EMR    EKG Interpretation   Date/Time:  Thursday April 16 2014 02:07:16 EDT Ventricular Rate:  88 PR Interval:  182 QRS Duration: 96 QT Interval:  408 QTC Calculation: 494 R Axis:   83 Text Interpretation:  Sinus rhythm Probable left atrial enlargement  Borderline repolarization abnormality Borderline prolonged QT interval No  significant change was found Confirmed by Donyell Carrell  MD, Emin Foree ([id number]) on  04/16/2014 4:23:01 AM      MDM   Final diagnoses:  Syncope, unspecified syncope type  Essential hypertension   Patient was evaluated and observed in the emergency department.  Labs and head CT without abnormalities.  No vomiting in the ER.  Wife reports patient is at normal baseline mental status.  Unclear if this was syncope.  He has gait instability since his stroke.  No indication for additional workup in the emergency department tonight.  I do not think  the patient is to be hospitalized.  He will continue to be monitored closely by his wife.  Close PCP followup.  He understands to return to the ER for new or worsening symptoms    Hoy Morn, MD 04/17/14 [phone]

## 2014-04-16 NOTE — ED Notes (Signed)
Pt was found in the floor and he had vomited on himself. Pt does not remember anything.

## 2014-05-06 ENCOUNTER — Encounter: Payer: Self-pay | Admitting: Family Medicine

## 2014-05-06 ENCOUNTER — Ambulatory Visit (INDEPENDENT_AMBULATORY_CARE_PROVIDER_SITE_OTHER): Payer: BC Managed Care – PPO | Admitting: Family Medicine

## 2014-05-06 VITALS — BP 150/100 | Ht 67.0 in | Wt 178.1 lb

## 2014-05-06 DIAGNOSIS — E785 Hyperlipidemia, unspecified: Secondary | ICD-10-CM

## 2014-05-06 DIAGNOSIS — G4733 Obstructive sleep apnea (adult) (pediatric): Secondary | ICD-10-CM

## 2014-05-06 DIAGNOSIS — N400 Enlarged prostate without lower urinary tract symptoms: Secondary | ICD-10-CM

## 2014-05-06 DIAGNOSIS — I10 Essential (primary) hypertension: Secondary | ICD-10-CM

## 2014-05-06 DIAGNOSIS — I6381 Other cerebral infarction due to occlusion or stenosis of small artery: Secondary | ICD-10-CM

## 2014-05-06 DIAGNOSIS — I6350 Cerebral infarction due to unspecified occlusion or stenosis of unspecified cerebral artery: Secondary | ICD-10-CM

## 2014-05-06 MED ORDER — PRAVASTATIN SODIUM 20 MG PO TABS: 20.0000 mg | ORAL_TABLET | Freq: Every day | ORAL | Status: DC

## 2014-05-06 MED ORDER — HYDROCHLOROTHIAZIDE 25 MG PO TABS: 25.0000 mg | ORAL_TABLET | Freq: Every morning | ORAL | Status: DC

## 2014-05-06 NOTE — Progress Notes (Signed)
   Subjective:    Patient ID: Seth Neal, male    DOB: [birth year], 58 y.o.   MRN: [id number]  Hypertension This is a chronic problem. The current episode started more than 1 year ago. The problem is unchanged. The problem is uncontrolled. There are no associated agents to hypertension. There are no known risk factors for coronary artery disease. Treatments tried: enalapril. The current treatment provides moderate improvement. There are no compliance problems.    Patient was seen about 3 weeks ago at [facility] ER for elevated blood pressure. Patient is still having problems with this issue.  Patient has had no new symptoms of stroke. Unfortunately still unsteady on his feet. Also unable to drive. Also ongoing difficulties with memory. Lives with wife and family. Next  Compliant with her blood pressure medication.  Compliant with lipid medication. No obvious side effects.    Review of Systems No headache no chest pain no back pain no loss of consciousness no abdominal pain no change in bowel habits    Objective:   Physical Exam Alert no apparent distress 1 4492 on repeat. HEENT normal. Lungs clear. Heart regular in rhythm. Ankles without edema. Neurological exam stable       Assessment & Plan:  Impression 1 hypertension suboptimal in control discussed. #2 hyperlipidemia to maintain medicine #3 status post stroke clinically stable but with residual neurological deficits. Patient once again expresses interest in driving and I once again discouraged him. And stated he should actually not tried. Plan maintain same meds. Adjust blood pressure meds as noted. Diet exercise discussed. Recheck in several months. WSL

## 2014-05-09 DIAGNOSIS — E785 Hyperlipidemia, unspecified: Secondary | ICD-10-CM | POA: Insufficient documentation

## 2014-05-14 ENCOUNTER — Telehealth: Payer: Self-pay | Admitting: Family Medicine

## 2014-05-14 DIAGNOSIS — M25579 Pain in unspecified ankle and joints of unspecified foot: Secondary | ICD-10-CM

## 2014-05-14 NOTE — Telephone Encounter (Signed)
Referral ordered in Epic. Patient notified. 

## 2014-05-14 NOTE — Telephone Encounter (Signed)
Patients daughter called and said that patients feel are in really bad shape and wants to know if we can refer to a podiatrist. She said she has clipped his toenails and scrubbed his feet over and over again, but they are no improving. She said he is not a diabetic.

## 2014-05-14 NOTE — Telephone Encounter (Signed)
Pod ref 

## 2014-06-24 ENCOUNTER — Ambulatory Visit: Payer: BC Managed Care – PPO | Admitting: Podiatry

## 2014-07-01 ENCOUNTER — Ambulatory Visit (INDEPENDENT_AMBULATORY_CARE_PROVIDER_SITE_OTHER): Payer: BC Managed Care – PPO | Admitting: Podiatry

## 2014-07-01 ENCOUNTER — Encounter: Payer: Self-pay | Admitting: Podiatry

## 2014-07-01 ENCOUNTER — Ambulatory Visit (INDEPENDENT_AMBULATORY_CARE_PROVIDER_SITE_OTHER): Payer: BC Managed Care – PPO

## 2014-07-01 VITALS — Ht 64.0 in | Wt 185.0 lb

## 2014-07-01 DIAGNOSIS — M799 Soft tissue disorder, unspecified: Secondary | ICD-10-CM

## 2014-07-01 DIAGNOSIS — M79609 Pain in unspecified limb: Secondary | ICD-10-CM

## 2014-07-01 DIAGNOSIS — M6740 Ganglion, unspecified site: Secondary | ICD-10-CM

## 2014-07-01 DIAGNOSIS — M79673 Pain in unspecified foot: Secondary | ICD-10-CM

## 2014-07-01 DIAGNOSIS — M67471 Ganglion, right ankle and foot: Secondary | ICD-10-CM

## 2014-07-01 DIAGNOSIS — M7989 Other specified soft tissue disorders: Secondary | ICD-10-CM

## 2014-07-01 DIAGNOSIS — B351 Tinea unguium: Secondary | ICD-10-CM

## 2014-07-01 NOTE — Progress Notes (Signed)
   Subjective:    Patient ID: Seth Neal, male    DOB: 15-Nov-1955, 58 y.o.   MRN: [id number]  HPI Comments: Mr. Seth Neal, 58 year old male, since the office today with painful mass in the first interspace and the right foot. He presents today with his daughter, Hart Rochester, who answers the majority of questions and gave a history. She states that the mass his been present for several years. He previously was seen in the office in October of 2010 and the patient was sent for an MRI. After the results were obtained and due to concerns of the results the patient was sent for evaluation at [facility]. Patient's daughter states that he has had multiple medical problems in the meantime and was unable to followup. She states that her father has been complaining of increasing pain in size to the mass on his right foot. Pain mostly with pressure and upon weightbearing and ambulation. He should also presents for painful elongated mycotic nails. No other complaints at this time.   Foot Pain      Review of Systems  Psychiatric/Behavioral:       Hx of strokes x 6. forgetfulness  All other systems reviewed and are negative.      Objective:   Physical Exam Awake, NAD; presented walking into the office DP/PT pulses decreased. CRT < 3 sec Protective sensation decreased with Semmes Weinstein monofilament, vibratory sensation decreased. Large soft tissue mass extending along the dorsal aspect of the first interspace on the right foot communicating to the plantar aspect of the foot and the plantar first metatarsal head. The mass appears to be soft. There are no overlying skin changes. Mild tenderness to palpation over the mass. Nails hypertrophic, dystrophic, elongated, brittle, yellow discoloration x10. No surrounding erythema or drainage. No open lesions.   MRI 08/13/2009- large irregular inhomogeneous soft tissue mass extending from the plantar to the dorsal aspect of the foot to the first web  interspace. Differential diagnosis of this appearance includes but is not limited to, fibrosarcoma, extra-abdominal fibromatosis, malignant fibrous histiocytoma, and giant cell tumor  X-Ray 07/01/14- large soft tissue mass noted over the first interspace and over the first digit/metatarsal. There are new areas of calcification along the lateral aspect of the hallux.    Assessment & Plan:  58 year old male with large right soft tissue mass, symptomatic onychomycosis. -Conservative versus surgical treatment were discussed with the patient and his daughter in detail including alternatives, risks, complications. -MRI was reviewed from 2010 with the patient/daugher -Given the findings and increasing in symptoms will refer to Dr. Janice Coffin, MD at [facility] and [place] sharply debrided [id number] without complications. -Followup in 3 months for nail debridement if needed. -We will send notes to Dr. Janice Coffin, MD at [facility] for evaluation of the soft tissue mass.

## 2014-07-01 NOTE — Patient Instructions (Signed)
Followup with Dr. Sullivan Lone, MD at [facility]. The number to make an appointment is [phone]

## 2014-07-03 ENCOUNTER — Telehealth: Payer: Self-pay | Admitting: Podiatry

## 2014-07-03 NOTE — Telephone Encounter (Signed)
Dara with Dr. Melvyn Neth office at [facility] called wanting to verify the patients phone numbers, stated that the two numbers she had were busy. I gave her the numbers we had in our system. She wanted to go on and give you his appointment date and time with Dr. Mylo Red. His appointment is scheduled for Tuesday July 07, 2014 at 9:30 am.

## 2014-08-20 ENCOUNTER — Other Ambulatory Visit: Payer: Self-pay | Admitting: Family Medicine

## 2014-09-30 ENCOUNTER — Ambulatory Visit: Payer: BC Managed Care – PPO | Admitting: Podiatry

## 2014-10-08 ENCOUNTER — Ambulatory Visit (INDEPENDENT_AMBULATORY_CARE_PROVIDER_SITE_OTHER): Payer: BC Managed Care – PPO | Admitting: Family Medicine

## 2014-10-08 ENCOUNTER — Encounter: Payer: Self-pay | Admitting: Family Medicine

## 2014-10-08 VITALS — BP 140/94 | Ht 67.0 in | Wt 182.4 lb

## 2014-10-08 DIAGNOSIS — I639 Cerebral infarction, unspecified: Secondary | ICD-10-CM

## 2014-10-08 DIAGNOSIS — Z79899 Other long term (current) drug therapy: Secondary | ICD-10-CM

## 2014-10-08 DIAGNOSIS — G4733 Obstructive sleep apnea (adult) (pediatric): Secondary | ICD-10-CM

## 2014-10-08 DIAGNOSIS — E785 Hyperlipidemia, unspecified: Secondary | ICD-10-CM

## 2014-10-08 DIAGNOSIS — Z125 Encounter for screening for malignant neoplasm of prostate: Secondary | ICD-10-CM

## 2014-10-08 DIAGNOSIS — I10 Essential (primary) hypertension: Secondary | ICD-10-CM

## 2014-10-08 DIAGNOSIS — I6381 Other cerebral infarction due to occlusion or stenosis of small artery: Secondary | ICD-10-CM

## 2014-10-08 MED ORDER — HYDROCHLOROTHIAZIDE 25 MG PO TABS: 25.0000 mg | ORAL_TABLET | Freq: Every morning | ORAL | Status: DC

## 2014-10-08 MED ORDER — ENALAPRIL MALEATE 20 MG PO TABS
20.0000 mg | ORAL_TABLET | Freq: Every day | ORAL | Status: DC
Start: 2014-10-08 — End: 2015-04-20

## 2014-10-08 MED ORDER — PRAVASTATIN SODIUM 20 MG PO TABS: 20.0000 mg | ORAL_TABLET | Freq: Every day | ORAL | Status: DC

## 2014-10-08 NOTE — Progress Notes (Signed)
   Subjective:    Patient ID: Seth Neal, male    DOB: [birth year], 58 y.o.   MRN: [id number]  Hypertension This is a chronic problem. The current episode started more than 1 year ago. The problem has been gradually improving since onset. The problem is controlled. There are no associated agents to hypertension. There are no known risk factors for coronary artery disease. Treatments tried: enalapril, HCTZ. The current treatment provides significant improvement. There are no compliance problems.     Patient states that he has no concerns at this time.   Still smokes a lot, has bm's on himself   Still has urination and bowel At times seems to lose his focus. Also not motivated at all. Also short-term forgetfulness very significant.  History of multiple lacunar infarcts. All of these symptoms are progressed since then.  Patient claims compliance with his medication.  History of hyperlipidemia. Compliant with medicine. No obvious side effects.   Review of Systems No headache no chest pain no back pain no abdominal pain no change in rash ROS otherwise negative    Objective:   Physical Exam  Alert blood pressure decent on repeat 138/84. HEENT normal. Lungs clear. Heart regular in rhythm. Ankles without edema. Unsteady gait noted. Speech somewhat dysphasic in nature      Assessment & Plan:  Impression 1 status post multi-infarct strokes. With substantial residual neurological difficulties. Long discussion held with patient and spouse, including how much wear and tear this is on both the patient and family members. #2 hypertension decent control #3 hyperlipidemia status uncertain. Plan appropriate blood work. Encouraged exercise strongly encouraged to stop smoking. Recheck as scheduled. Further recommendations based results. WSL

## 2015-04-20 ENCOUNTER — Ambulatory Visit (INDEPENDENT_AMBULATORY_CARE_PROVIDER_SITE_OTHER): Payer: Medicare Other | Admitting: Family Medicine

## 2015-04-20 ENCOUNTER — Encounter: Payer: Self-pay | Admitting: Family Medicine

## 2015-04-20 VITALS — BP 134/88 | Ht 67.0 in | Wt 173.0 lb

## 2015-04-20 DIAGNOSIS — I639 Cerebral infarction, unspecified: Secondary | ICD-10-CM | POA: Diagnosis not present

## 2015-04-20 DIAGNOSIS — Z125 Encounter for screening for malignant neoplasm of prostate: Secondary | ICD-10-CM

## 2015-04-20 DIAGNOSIS — E785 Hyperlipidemia, unspecified: Secondary | ICD-10-CM

## 2015-04-20 DIAGNOSIS — I10 Essential (primary) hypertension: Secondary | ICD-10-CM | POA: Diagnosis not present

## 2015-04-20 DIAGNOSIS — Z79899 Other long term (current) drug therapy: Secondary | ICD-10-CM | POA: Diagnosis not present

## 2015-04-20 DIAGNOSIS — I6381 Other cerebral infarction due to occlusion or stenosis of small artery: Secondary | ICD-10-CM

## 2015-04-20 MED ORDER — PRAVASTATIN SODIUM 20 MG PO TABS: 20.0000 mg | ORAL_TABLET | Freq: Every day | ORAL | Status: DC

## 2015-04-20 MED ORDER — HYDROCHLOROTHIAZIDE 25 MG PO TABS: 25.0000 mg | ORAL_TABLET | Freq: Every morning | ORAL | Status: DC

## 2015-04-20 MED ORDER — ENALAPRIL MALEATE 20 MG PO TABS: 20.0000 mg | ORAL_TABLET | Freq: Every day | ORAL | Status: DC

## 2015-04-20 NOTE — Progress Notes (Signed)
   Subjective:    Patient ID: Seth Neal, male    DOB: 1956-01-28, 59 y.o.   MRN: [id number] Pt arrives today with wife Seth Neal.  Hyperlipidemia This is a chronic problem. The current episode started more than 1 year ago. Compliance problems include adherence to exercise.    Patient did not get blood work work last winter is requested.  Ongoing difficulties with unsteadiness. X-ray has not fell. No longer driving. Unfortunately having very significant difficulty with motivation. Wife states he premed sits in a chair all day. Often even forgets to drop the ashes from the cigarettes in the ashtray. Just knocks month before. Wife quite frustrated.  Claims compliance with lipid medicine. No obvious side effect. Eats unfortunately. Much anything he wants.  Has not had any further episodes of true stroke or TIA symptomatology.  t states no concerns or problems today. Needs refills on meds.   Review of Systems No headache no chest pain no back pain no abdominal pain no change about have some blood cell    Objective:   Physical Exam Alert vitals stable blood pressure initially elevated improved on repeat. HEENT normal. Lungs clear. Heart rare rhythm. No focal neurological deficit. Gait somewhat unsteady. Ankles without edema       Assessment & Plan:  Impression 1 hypertension good control #2 hyperlipidemia status uncertain #3 status post stroke no recurrence unfortunately substantial ongoing challenges of personality change which is pitting great stress on family. Wife claims her really is no one else that help look after him. Long talk with wife how his actions or not intentional and are coming from his stroke. Long talk with patient about importance of trying to comply with wife's concerns plan diet discussed exercise discussed appropriate blood work. Recheck as scheduled WSL

## 2015-05-01 DIAGNOSIS — E785 Hyperlipidemia, unspecified: Secondary | ICD-10-CM | POA: Diagnosis not present

## 2015-05-01 DIAGNOSIS — I10 Essential (primary) hypertension: Secondary | ICD-10-CM | POA: Diagnosis not present

## 2015-05-01 DIAGNOSIS — Z79899 Other long term (current) drug therapy: Secondary | ICD-10-CM | POA: Diagnosis not present

## 2015-05-01 DIAGNOSIS — Z125 Encounter for screening for malignant neoplasm of prostate: Secondary | ICD-10-CM | POA: Diagnosis not present

## 2015-05-03 LAB — LIPID PANEL
CHOL/HDL RATIO: 4.3 ratio (ref 0.0–5.0)
CHOLESTEROL TOTAL: 156 mg/dL (ref 100–199)
HDL: 36 mg/dL — AB (ref >39)
LDL Calculated: 76 mg/dL (ref 0–99)
Triglycerides: 221 mg/dL — ABNORMAL HIGH (ref 0–149)
VLDL Cholesterol Cal: 44 mg/dL — ABNORMAL HIGH (ref 5–40)

## 2015-05-03 LAB — HEPATIC FUNCTION PANEL
ALT: 17 IU/L (ref 0–44)
AST: 21 IU/L (ref 0–40)
Albumin: 3.9 g/dL (ref 3.6–4.8)
Alkaline Phosphatase: 94 IU/L (ref 39–117)
Bilirubin Total: <0.2 mg/dL (ref 0.0–1.2)
Bilirubin, Direct: 0.08 mg/dL (ref 0.00–0.40)
Total Protein: 6.8 g/dL (ref 6.0–8.5)

## 2015-05-03 LAB — PSA: Prostate Specific Ag, Serum: 1.7 ng/mL (ref 0.0–4.0)

## 2015-05-03 LAB — BASIC METABOLIC PANEL
BUN / CREAT RATIO: 11 (ref 10–22)
BUN: 12 mg/dL (ref 8–27)
CALCIUM: 9.2 mg/dL (ref 8.6–10.2)
CHLORIDE: 99 mmol/L (ref 97–108)
CO2: 24 mmol/L (ref 18–29)
CREATININE: 1.09 mg/dL (ref 0.76–1.27)
GFR calc Af Amer: 85 mL/min/{1.73_m2} (ref >59)
GFR calc non Af Amer: 73 mL/min/{1.73_m2} (ref >59)
GLUCOSE: 123 mg/dL — AB (ref 65–99)
Potassium: 3.9 mmol/L (ref 3.5–5.2)
Sodium: 140 mmol/L (ref 134–144)

## 2015-05-31 ENCOUNTER — Ambulatory Visit (INDEPENDENT_AMBULATORY_CARE_PROVIDER_SITE_OTHER): Payer: Medicare Other | Admitting: Family Medicine

## 2015-05-31 ENCOUNTER — Encounter: Payer: Self-pay | Admitting: Family Medicine

## 2015-05-31 VITALS — BP 124/82 | Ht 67.0 in | Wt 173.4 lb

## 2015-05-31 DIAGNOSIS — R7301 Impaired fasting glucose: Secondary | ICD-10-CM | POA: Diagnosis not present

## 2015-05-31 DIAGNOSIS — I639 Cerebral infarction, unspecified: Secondary | ICD-10-CM | POA: Diagnosis not present

## 2015-05-31 DIAGNOSIS — E785 Hyperlipidemia, unspecified: Secondary | ICD-10-CM

## 2015-05-31 DIAGNOSIS — B351 Tinea unguium: Secondary | ICD-10-CM | POA: Diagnosis not present

## 2015-05-31 DIAGNOSIS — I10 Essential (primary) hypertension: Secondary | ICD-10-CM

## 2015-05-31 DIAGNOSIS — L603 Nail dystrophy: Secondary | ICD-10-CM | POA: Diagnosis not present

## 2015-05-31 NOTE — Progress Notes (Signed)
   Subjective:    Patient ID: Seth Neal, male    DOB: 1956/03/19, 59 y.o.   MRN: [id number]  Hyperlipidemia This is a chronic problem. The current episode started more than 1 year ago. There are no compliance problems.     Patient is in today to discuss lab results from 7/16. Patient also has c/o of foot pain. Patient has seen podiatrist in the past. History of severe trouble with nails. Now causing pain in both feet. Has required trimming in the past.  Compliant with blood pressure medication. No obvious side effects.  Compliant with lipid medicine. Has cut fats down and diet. Family wondering about the blood work results.  Positive family history diabetes. Patient has a terrible Tylenol when it comes her sugars. Tremendous number soft drinks sugar, sweets, I see what sugar etc.   Patient would like a referral to a podiatrist that is local. Patient states no other concerns this visit. Results for orders placed or performed in visit on 04/20/15  Lipid panel  Result Value Ref Range   Cholesterol, Total 156 100 - 199 mg/dL   Triglycerides 221 (H) 0 - 149 mg/dL   HDL 36 (L) >39 mg/dL   VLDL Cholesterol Cal 44 (H) 5 - 40 mg/dL   LDL Calculated 76 0 - 99 mg/dL   Chol/HDL Ratio 4.3 0.0 - 5.0 ratio units  Hepatic function panel  Result Value Ref Range   Total Protein 6.8 6.0 - 8.5 g/dL   Albumin 3.9 3.6 - 4.8 g/dL   Bilirubin Total <0.2 0.0 - 1.2 mg/dL   Bilirubin, Direct 0.08 0.00 - 0.40 mg/dL   Alkaline Phosphatase 94 39 - 117 IU/L   AST 21 0 - 40 IU/L   ALT 17 0 - 44 IU/L  Basic metabolic panel  Result Value Ref Range   Glucose 123 (H) 65 - 99 mg/dL   BUN 12 8 - 27 mg/dL   Creatinine, Ser 1.09 0.76 - 1.27 mg/dL   GFR calc non Af Amer 73 >59 mL/min/1.73   GFR calc Af Amer 85 >59 mL/min/1.73   BUN/Creatinine Ratio 11 10 - 22   Sodium 140 134 - 144 mmol/L   Potassium 3.9 3.5 - 5.2 mmol/L   Chloride 99 97 - 108 mmol/L   CO2 24 18 - 29 mmol/L   Calcium 9.2 8.6 - 10.2 mg/dL    PSA  Result Value Ref Range   Prostate Specific Ag, Serum 1.7 0.0 - 4.0 ng/mL   Pt has siblings   Day # [id number]  Review of Systems No headache no chest pain no back pain no abdominal pain no change in bowel habits no blood in stool    Objective:   Physical Exam  Alert vitals stable blood pressure good on repeat H&T normal lungs clear heart regular in rhythm toenails dystrophic considerably      Assessment & Plan:  Impression 1 impaired fasting glucose discussed patient close to diabetes diagnosis discussed at length #2 hyperlipidemia good control recently good #3 hypertension good control #4 dystrophic nails with substantial discomfort. Plan podiatry referral. Diet discussed. Maintain same medications. Follow-up as scheduled WSL

## 2015-06-10 ENCOUNTER — Encounter: Payer: Self-pay | Admitting: Family Medicine

## 2015-07-01 DIAGNOSIS — I739 Peripheral vascular disease, unspecified: Secondary | ICD-10-CM | POA: Diagnosis not present

## 2015-07-01 DIAGNOSIS — B351 Tinea unguium: Secondary | ICD-10-CM | POA: Diagnosis not present

## 2015-10-22 ENCOUNTER — Ambulatory Visit (INDEPENDENT_AMBULATORY_CARE_PROVIDER_SITE_OTHER): Payer: Medicare Other | Admitting: Family Medicine

## 2015-10-22 ENCOUNTER — Encounter: Payer: Self-pay | Admitting: Family Medicine

## 2015-10-22 VITALS — BP 124/80 | Ht 67.0 in | Wt 179.0 lb

## 2015-10-22 DIAGNOSIS — I639 Cerebral infarction, unspecified: Secondary | ICD-10-CM | POA: Diagnosis not present

## 2015-10-22 DIAGNOSIS — Z79899 Other long term (current) drug therapy: Secondary | ICD-10-CM | POA: Diagnosis not present

## 2015-10-22 DIAGNOSIS — I6381 Other cerebral infarction due to occlusion or stenosis of small artery: Secondary | ICD-10-CM

## 2015-10-22 DIAGNOSIS — E785 Hyperlipidemia, unspecified: Secondary | ICD-10-CM | POA: Diagnosis not present

## 2015-10-22 DIAGNOSIS — R739 Hyperglycemia, unspecified: Secondary | ICD-10-CM

## 2015-10-22 DIAGNOSIS — I10 Essential (primary) hypertension: Secondary | ICD-10-CM | POA: Diagnosis not present

## 2015-10-22 MED ORDER — ENALAPRIL MALEATE 20 MG PO TABS: 20.0000 mg | ORAL_TABLET | Freq: Every day | ORAL | Status: DC

## 2015-10-22 MED ORDER — PRAVASTATIN SODIUM 20 MG PO TABS: 20.0000 mg | ORAL_TABLET | Freq: Every day | ORAL | Status: DC

## 2015-10-22 MED ORDER — HYDROCHLOROTHIAZIDE 25 MG PO TABS: 25.0000 mg | ORAL_TABLET | Freq: Every morning | ORAL | Status: DC

## 2015-10-22 NOTE — Progress Notes (Signed)
   Subjective:    Patient ID: Seth Neal, male    DOB: [birth year], 60 y.o.   MRN: [id number]  Hypertension This is a chronic problem. Treatments tried: enalapril, hctz. Compliance problems include diet and exercise.    Pt states no concerns or problems. Needs refills on meds. Wants 90 day supply.   Right hand shakes sonme, still unsteady often when walking.  Family frustrated by patient's lack of initiative. Often has some confusion at home. Often miss his ashtray and on metastasis the floor. He is unintended for couple hours in the afternoon. Currently he is been allowed to smoke during that time  Patient is compliant with lipid medication. Not missing any doses. Meds were reviewed today. Prior blood work reviewed today.  Patient is compliant with blood pressure medication. No obvious side effects. Watching salt intake. Does not miss a dose. Blood pressure meds reviewed today.  H does history of stroke. No new stroke symptoms. Just unsteadiness and tremor as noted above. Review of Systems No headache no chest pain no back pain no abdominal pain no change in bowel habits ROS otherwise negative    Objective:   Physical Exam  Alert vitals stable blood pressure good on repeat HEENT normal. Lungs clear. Heart regular in rhythm. Ankles without edema. Neuro exam mild to moderate cerebellar findings with finger to nose. Slight tremulousness hands      Assessment & Plan:  Impression status post stroke with residual neurological dysfunction and residual mental functioning. Compromise. #2 hypertension good control discussed #3 hyperlipidemia status uncertain maintain same meds for now #4 impaired fasting glucose. Patient was approaching diabetes last check time the recheck discussed plan appropriate blood work. Meds refilled. Diet exercise. Gets smoking out of household. When patient is by himself. Follow-up as scheduled

## 2015-11-08 ENCOUNTER — Telehealth: Payer: Self-pay | Admitting: Family Medicine

## 2015-11-08 DIAGNOSIS — I639 Cerebral infarction, unspecified: Secondary | ICD-10-CM

## 2015-11-08 NOTE — Telephone Encounter (Signed)
We can try

## 2015-11-08 NOTE — Telephone Encounter (Signed)
Pt is requesting a referral to [facility] in [place] to see if the pt qualifies.    Office # [phone] Fax# [phone]

## 2015-11-09 NOTE — Telephone Encounter (Signed)
Order for referral put in. Pt notified on vm.

## 2015-11-12 ENCOUNTER — Encounter: Payer: Self-pay | Admitting: Family Medicine

## 2015-11-12 ENCOUNTER — Telehealth: Payer: Self-pay | Admitting: Family Medicine

## 2015-11-12 NOTE — Telephone Encounter (Signed)
[facility] called to say they will be taking him on as a patient  He will be seen starting tomorrow   We referred the patient to [facility]

## 2015-11-13 DIAGNOSIS — R739 Hyperglycemia, unspecified: Secondary | ICD-10-CM | POA: Diagnosis not present

## 2015-11-13 DIAGNOSIS — R471 Dysarthria and anarthria: Secondary | ICD-10-CM | POA: Diagnosis not present

## 2015-11-13 DIAGNOSIS — I69928 Other speech and language deficits following unspecified cerebrovascular disease: Secondary | ICD-10-CM | POA: Diagnosis not present

## 2015-11-13 DIAGNOSIS — Z79899 Other long term (current) drug therapy: Secondary | ICD-10-CM | POA: Diagnosis not present

## 2015-11-13 DIAGNOSIS — E785 Hyperlipidemia, unspecified: Secondary | ICD-10-CM | POA: Diagnosis not present

## 2015-11-14 ENCOUNTER — Encounter: Payer: Self-pay | Admitting: Family Medicine

## 2015-11-14 DIAGNOSIS — I69928 Other speech and language deficits following unspecified cerebrovascular disease: Secondary | ICD-10-CM | POA: Diagnosis not present

## 2015-11-14 DIAGNOSIS — R471 Dysarthria and anarthria: Secondary | ICD-10-CM | POA: Diagnosis not present

## 2015-11-15 DIAGNOSIS — I69928 Other speech and language deficits following unspecified cerebrovascular disease: Secondary | ICD-10-CM | POA: Diagnosis not present

## 2015-11-15 DIAGNOSIS — R471 Dysarthria and anarthria: Secondary | ICD-10-CM | POA: Diagnosis not present

## 2015-11-15 LAB — HEPATIC FUNCTION PANEL
ALT: 15 IU/L (ref 0–44)
AST: 24 IU/L (ref 0–40)
Albumin: 4.2 g/dL (ref 3.6–4.8)
Alkaline Phosphatase: 98 IU/L (ref 39–117)
Bilirubin Total: 0.3 mg/dL (ref 0.0–1.2)
Bilirubin, Direct: 0.08 mg/dL (ref 0.00–0.40)
TOTAL PROTEIN: 7.1 g/dL (ref 6.0–8.5)

## 2015-11-15 LAB — LIPID PANEL
CHOL/HDL RATIO: 3.7 ratio (ref 0.0–5.0)
Cholesterol, Total: 150 mg/dL (ref 100–199)
HDL: 41 mg/dL (ref >39)
LDL Calculated: 89 mg/dL (ref 0–99)
Triglycerides: 102 mg/dL (ref 0–149)
VLDL CHOLESTEROL CAL: 20 mg/dL (ref 5–40)

## 2015-11-15 LAB — GLUCOSE, RANDOM: GLUCOSE: 108 mg/dL — AB (ref 65–99)

## 2015-11-15 LAB — HEMOGLOBIN A1C
ESTIMATED AVERAGE GLUCOSE: 140 mg/dL
HEMOGLOBIN A1C: 6.5 % — AB (ref 4.8–5.6)

## 2015-11-16 DIAGNOSIS — I69928 Other speech and language deficits following unspecified cerebrovascular disease: Secondary | ICD-10-CM | POA: Diagnosis not present

## 2015-11-16 DIAGNOSIS — R471 Dysarthria and anarthria: Secondary | ICD-10-CM | POA: Diagnosis not present

## 2015-11-17 DIAGNOSIS — I69928 Other speech and language deficits following unspecified cerebrovascular disease: Secondary | ICD-10-CM | POA: Diagnosis not present

## 2015-11-17 DIAGNOSIS — R471 Dysarthria and anarthria: Secondary | ICD-10-CM | POA: Diagnosis not present

## 2015-11-18 DIAGNOSIS — R471 Dysarthria and anarthria: Secondary | ICD-10-CM | POA: Diagnosis not present

## 2015-11-18 DIAGNOSIS — I69928 Other speech and language deficits following unspecified cerebrovascular disease: Secondary | ICD-10-CM | POA: Diagnosis not present

## 2015-11-22 DIAGNOSIS — I69928 Other speech and language deficits following unspecified cerebrovascular disease: Secondary | ICD-10-CM | POA: Diagnosis not present

## 2015-11-22 DIAGNOSIS — R471 Dysarthria and anarthria: Secondary | ICD-10-CM | POA: Diagnosis not present

## 2015-11-22 DIAGNOSIS — I10 Essential (primary) hypertension: Secondary | ICD-10-CM | POA: Diagnosis not present

## 2015-11-22 DIAGNOSIS — R2689 Other abnormalities of gait and mobility: Secondary | ICD-10-CM | POA: Diagnosis not present

## 2015-11-23 DIAGNOSIS — R471 Dysarthria and anarthria: Secondary | ICD-10-CM | POA: Diagnosis not present

## 2015-11-23 DIAGNOSIS — I69928 Other speech and language deficits following unspecified cerebrovascular disease: Secondary | ICD-10-CM | POA: Diagnosis not present

## 2015-11-24 DIAGNOSIS — R471 Dysarthria and anarthria: Secondary | ICD-10-CM | POA: Diagnosis not present

## 2015-11-24 DIAGNOSIS — I69928 Other speech and language deficits following unspecified cerebrovascular disease: Secondary | ICD-10-CM | POA: Diagnosis not present

## 2015-11-25 ENCOUNTER — Telehealth: Payer: Self-pay | Admitting: Family Medicine

## 2015-11-25 DIAGNOSIS — I69928 Other speech and language deficits following unspecified cerebrovascular disease: Secondary | ICD-10-CM | POA: Diagnosis not present

## 2015-11-25 DIAGNOSIS — R471 Dysarthria and anarthria: Secondary | ICD-10-CM | POA: Diagnosis not present

## 2015-11-25 NOTE — Telephone Encounter (Signed)
Cst, cont cking wtice per wk, send [place] reslts again in one mo

## 2015-11-25 NOTE — Telephone Encounter (Signed)
Received phone call from [place] with blood pressure readings for patient. Patient blood pressures have been as follows:  127/74 144/85 155/105 136/72 126/78 160/100  These readings were taken over the course of a week.

## 2015-11-25 NOTE — Telephone Encounter (Signed)
[facility] 11/25/15

## 2015-11-26 NOTE — Telephone Encounter (Signed)
Spoke with patient's wife and informed her per Dr.Steve Luking- Continue checking twice per wk, send [place] results again in one month. Patient wife verbalized understanding.

## 2015-12-01 DIAGNOSIS — I69928 Other speech and language deficits following unspecified cerebrovascular disease: Secondary | ICD-10-CM | POA: Diagnosis not present

## 2015-12-01 DIAGNOSIS — R471 Dysarthria and anarthria: Secondary | ICD-10-CM | POA: Diagnosis not present

## 2015-12-02 ENCOUNTER — Telehealth: Payer: Self-pay | Admitting: Family Medicine

## 2015-12-02 DIAGNOSIS — R471 Dysarthria and anarthria: Secondary | ICD-10-CM | POA: Diagnosis not present

## 2015-12-02 DIAGNOSIS — I69928 Other speech and language deficits following unspecified cerebrovascular disease: Secondary | ICD-10-CM | POA: Diagnosis not present

## 2015-12-02 NOTE — Telephone Encounter (Signed)
ok 

## 2015-12-02 NOTE — Telephone Encounter (Signed)
Inez Catalina from [facility] is calling to let you know she will be discharging Mr Waffle From their services as of today, his  BP pressure 124/70 - 136/78 in the last week

## 2015-12-03 ENCOUNTER — Encounter: Payer: Self-pay | Admitting: Family Medicine

## 2015-12-03 ENCOUNTER — Ambulatory Visit (INDEPENDENT_AMBULATORY_CARE_PROVIDER_SITE_OTHER): Payer: Medicare Other | Admitting: Family Medicine

## 2015-12-03 VITALS — BP 122/80 | Ht 67.0 in | Wt 177.0 lb

## 2015-12-03 DIAGNOSIS — I639 Cerebral infarction, unspecified: Secondary | ICD-10-CM | POA: Diagnosis not present

## 2015-12-03 DIAGNOSIS — E119 Type 2 diabetes mellitus without complications: Secondary | ICD-10-CM

## 2015-12-03 NOTE — Progress Notes (Signed)
   Subjective:    Patient ID: Seth Neal, male    DOB: 01-24-1956, 60 y.o.   MRN: [id number]  Diabetes He presents for his initial diabetic visit. He has type 2 diabetes mellitus. There are no hypoglycemic associated symptoms. There are no diabetic associated symptoms. There are no hypoglycemic complications. There are no diabetic complications. There are no known risk factors for coronary artery disease. When asked about current treatments, none were reported.   Patient has no concerns at this time.   Results for orders placed or performed in visit on 10/22/15  Lipid panel  Result Value Ref Range   Cholesterol, Total 150 100 - 199 mg/dL   Triglycerides 102 0 - 149 mg/dL   HDL 41 >39 mg/dL   VLDL Cholesterol Cal 20 5 - 40 mg/dL   LDL Calculated 89 0 - 99 mg/dL   Chol/HDL Ratio 3.7 0.0 - 5.0 ratio units  Hepatic function panel  Result Value Ref Range   Total Protein 7.1 6.0 - 8.5 g/dL   Albumin 4.2 3.6 - 4.8 g/dL   Bilirubin Total 0.3 0.0 - 1.2 mg/dL   Bilirubin, Direct 0.08 0.00 - 0.40 mg/dL   Alkaline Phosphatase 98 39 - 117 IU/L   AST 24 0 - 40 IU/L   ALT 15 0 - 44 IU/L  Hemoglobin A1c  Result Value Ref Range   Hgb A1c MFr Bld 6.5 (H) 4.8 - 5.6 %   Est. average glucose Bld gHb Est-mCnc 140 mg/dL  Glucose, random  Result Value Ref Range   Glucose 108 (H) 65 - 99 mg/dL    Review of Systems No headache, no major weight loss or weight gain, no chest pain no back pain abdominal pain no change in bowel habits complete ROS otherwise negative     Objective:   Physical Exam  Alert no acute distress. He HEENT normal. Lungs clear heart regular in rhythm ankles without edema C diabetic foot exam      Assessment & Plan:  Impression type 2 diabetes discussed at great length with patient and several family members. There is some family history this. Patient has very poor dietary habits. Unfortunately with his multiple stroke status his insight into all of this is not the best.  Plan with A1c of 6.5 no need for medications. Patient drinks 2 L of regular sunder up per day must stop this immediately. Referral to free session at hospital for diabetes education. Glucometer prescribed. Check sugars once per week. Many questions answered at least 25 minutes spent most in discussion recheck in several months. A1c then [facility]

## 2015-12-04 DIAGNOSIS — E119 Type 2 diabetes mellitus without complications: Secondary | ICD-10-CM | POA: Insufficient documentation

## 2015-12-06 ENCOUNTER — Telehealth: Payer: Self-pay

## 2015-12-06 NOTE — Telephone Encounter (Signed)
Patient Assist Forms for Xarelto filled out.

## 2015-12-09 ENCOUNTER — Telehealth: Payer: Self-pay | Admitting: Family Medicine

## 2015-12-09 DIAGNOSIS — R471 Dysarthria and anarthria: Secondary | ICD-10-CM | POA: Diagnosis not present

## 2015-12-09 DIAGNOSIS — I69928 Other speech and language deficits following unspecified cerebrovascular disease: Secondary | ICD-10-CM | POA: Diagnosis not present

## 2015-12-09 NOTE — Telephone Encounter (Signed)
sure

## 2015-12-09 NOTE — Telephone Encounter (Signed)
Would like to request an extension for speech therapy for 3 more visits.

## 2015-12-09 NOTE — Telephone Encounter (Signed)
Left message on vm stating 3 more visits were ok per doctor

## 2015-12-14 DIAGNOSIS — I69928 Other speech and language deficits following unspecified cerebrovascular disease: Secondary | ICD-10-CM | POA: Diagnosis not present

## 2015-12-14 DIAGNOSIS — R471 Dysarthria and anarthria: Secondary | ICD-10-CM | POA: Diagnosis not present

## 2015-12-20 ENCOUNTER — Other Ambulatory Visit: Payer: Self-pay

## 2015-12-20 MED ORDER — BLOOD GLUCOSE MONITOR KIT: PACK | Status: DC

## 2015-12-24 DIAGNOSIS — R471 Dysarthria and anarthria: Secondary | ICD-10-CM | POA: Diagnosis not present

## 2015-12-24 DIAGNOSIS — I69928 Other speech and language deficits following unspecified cerebrovascular disease: Secondary | ICD-10-CM | POA: Diagnosis not present

## 2015-12-28 DIAGNOSIS — R471 Dysarthria and anarthria: Secondary | ICD-10-CM | POA: Diagnosis not present

## 2015-12-28 DIAGNOSIS — I69928 Other speech and language deficits following unspecified cerebrovascular disease: Secondary | ICD-10-CM | POA: Diagnosis not present

## 2016-03-01 ENCOUNTER — Ambulatory Visit: Payer: Medicare Other | Admitting: Family Medicine

## 2016-03-30 ENCOUNTER — Ambulatory Visit (INDEPENDENT_AMBULATORY_CARE_PROVIDER_SITE_OTHER): Payer: Medicare Other | Admitting: Family Medicine

## 2016-03-30 ENCOUNTER — Encounter: Payer: Self-pay | Admitting: Family Medicine

## 2016-03-30 VITALS — BP 130/80 | Ht 67.0 in | Wt 180.4 lb

## 2016-03-30 DIAGNOSIS — G4733 Obstructive sleep apnea (adult) (pediatric): Secondary | ICD-10-CM

## 2016-03-30 DIAGNOSIS — I10 Essential (primary) hypertension: Secondary | ICD-10-CM | POA: Diagnosis not present

## 2016-03-30 DIAGNOSIS — E119 Type 2 diabetes mellitus without complications: Secondary | ICD-10-CM

## 2016-03-30 DIAGNOSIS — N400 Enlarged prostate without lower urinary tract symptoms: Secondary | ICD-10-CM | POA: Diagnosis not present

## 2016-03-30 DIAGNOSIS — I6381 Other cerebral infarction due to occlusion or stenosis of small artery: Secondary | ICD-10-CM

## 2016-03-30 DIAGNOSIS — F0391 Unspecified dementia with behavioral disturbance: Secondary | ICD-10-CM | POA: Diagnosis not present

## 2016-03-30 DIAGNOSIS — F03918 Unspecified dementia, unspecified severity, with other behavioral disturbance: Secondary | ICD-10-CM

## 2016-03-30 DIAGNOSIS — I639 Cerebral infarction, unspecified: Secondary | ICD-10-CM

## 2016-03-30 LAB — POCT GLYCOSYLATED HEMOGLOBIN (HGB A1C): HEMOGLOBIN A1C: 5.8

## 2016-03-30 MED ORDER — ENALAPRIL MALEATE 20 MG PO TABS: 20.0000 mg | ORAL_TABLET | Freq: Every day | ORAL | Status: DC

## 2016-03-30 MED ORDER — HYDROCHLOROTHIAZIDE 25 MG PO TABS: 25.0000 mg | ORAL_TABLET | Freq: Every morning | ORAL | Status: DC

## 2016-03-30 MED ORDER — BLOOD GLUCOSE MONITOR KIT: PACK | Status: AC

## 2016-03-30 MED ORDER — PRAVASTATIN SODIUM 20 MG PO TABS: 20.0000 mg | ORAL_TABLET | Freq: Every day | ORAL | Status: DC

## 2016-03-30 NOTE — Progress Notes (Signed)
   Subjective:    Patient ID: Seth Neal, male    DOB: Nov 07, 1955, 59 y.o.   MRN: [id number] Patient arrives office with numerous concerns Diabetes He presents for his follow-up diabetic visit. He has type 2 diabetes mellitus. There are no hypoglycemic associated symptoms. There are no diabetic associated symptoms. There are no hypoglycemic complications. There are no diabetic complications. There are no known risk factors for coronary artery disease. When asked about current treatments, none were reported. He is compliant with treatment all of the time.  Not checking his own sugars and would prefer not to, but definitely has improved his diet Patient states that he has no new concerns at this time.   Patient has not had a recent eye exam.   Results for orders placed or performed in visit on 03/30/16  POCT glycosylated hemoglobin (Hb A1C)  Result Value Ref Range   Hemoglobin A1C 5.8    Compliant with lipid medication. Takes faithfully. No obvious side effects. Watching fat in diet. Prior blood work reviewed  Compliant blood pressure medication. No obvious side effects. Watching note salt intake. Does not miss a dose   Review of Systems No headache, no major weight loss or weight gain, no chest pain no back pain abdominal pain no change in bowel habits complete ROS otherwise negative     Objective:   Physical Exam  Alert vital stable blood pressure good on repeat. 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

## 2016-04-21 ENCOUNTER — Ambulatory Visit: Payer: Medicare Other | Admitting: Family Medicine

## 2016-05-11 DIAGNOSIS — B351 Tinea unguium: Secondary | ICD-10-CM | POA: Diagnosis not present

## 2016-05-11 DIAGNOSIS — I739 Peripheral vascular disease, unspecified: Secondary | ICD-10-CM | POA: Diagnosis not present

## 2016-06-29 ENCOUNTER — Ambulatory Visit: Payer: Medicare Other | Admitting: Family Medicine

## 2016-07-20 ENCOUNTER — Ambulatory Visit (INDEPENDENT_AMBULATORY_CARE_PROVIDER_SITE_OTHER): Payer: Medicare Other | Admitting: Family Medicine

## 2016-07-20 ENCOUNTER — Encounter: Payer: Self-pay | Admitting: Family Medicine

## 2016-07-20 VITALS — BP 120/70 | Ht 67.0 in | Wt 185.0 lb

## 2016-07-20 DIAGNOSIS — Z79899 Other long term (current) drug therapy: Secondary | ICD-10-CM | POA: Diagnosis not present

## 2016-07-20 DIAGNOSIS — E785 Hyperlipidemia, unspecified: Secondary | ICD-10-CM | POA: Diagnosis not present

## 2016-07-20 DIAGNOSIS — Z125 Encounter for screening for malignant neoplasm of prostate: Secondary | ICD-10-CM | POA: Diagnosis not present

## 2016-07-20 DIAGNOSIS — E119 Type 2 diabetes mellitus without complications: Secondary | ICD-10-CM

## 2016-07-20 DIAGNOSIS — I639 Cerebral infarction, unspecified: Secondary | ICD-10-CM | POA: Diagnosis not present

## 2016-07-20 DIAGNOSIS — I10 Essential (primary) hypertension: Secondary | ICD-10-CM | POA: Diagnosis not present

## 2016-07-20 LAB — POCT GLYCOSYLATED HEMOGLOBIN (HGB A1C): HEMOGLOBIN A1C: 6.6

## 2016-07-20 MED ORDER — PRAVASTATIN SODIUM 20 MG PO TABS: 20.0000 mg | ORAL_TABLET | Freq: Every day | ORAL | 1 refills | Status: DC

## 2016-07-20 MED ORDER — HYDROCHLOROTHIAZIDE 25 MG PO TABS: 25.0000 mg | ORAL_TABLET | Freq: Every morning | ORAL | 1 refills | Status: DC

## 2016-07-20 MED ORDER — ENALAPRIL MALEATE 20 MG PO TABS: 20.0000 mg | ORAL_TABLET | Freq: Every day | ORAL | 1 refills | Status: AC

## 2016-07-20 NOTE — Progress Notes (Signed)
   Subjective:    Patient ID: Seth Neal, male    DOB: 11/10/1955, 60 y.o.   MRN: [id number] Patient arrives office with numerous concerns Diabetes  He presents for his follow-up diabetic visit. He has type 2 diabetes mellitus. There are no hypoglycemic associated symptoms. There are no diabetic associated symptoms. There are no hypoglycemic complications. There are no diabetic complications. There are no known risk factors for coronary artery disease. Current diabetic treatment includes diet. He is compliant with treatment all of the time.   Has not had an eye exam yet.   Results for orders placed or performed in visit on 07/20/16  POCT glycosylated hemoglobin (Hb A1C)  Result Value Ref Range   Hemoglobin A1C 6.6     Blood pressure medicine and blood pressure levels reviewed today with patient. Compliant with blood pressure medicine. States does not miss a dose. No obvious side effects. Blood pressure generally good when checked elsewhere. Watching salt intake.   Patient continues to take lipid medication regularly. No obvious side effects from it. Generally does not miss a dose. Prior blood work results are reviewed with patient. Patient continues to work on fat intake in diet  Patient has no concerns at this time.   Patient declines flu shot.  Unfortunately still smoking. Next  Fortunately no new signs of stroke although ongoing challenges with unsteady gait. Also periods of confusion still persists in concerning to the family understandably  Review of Systems No headache, no major weight loss or weight gain, no chest pain no back pain abdominal pain no change in bowel habits complete ROS otherwise negative     Objective:   Physical Exam Alert vitals stable, NAD. Blood pressure good on repeat. HEENT normal. Lungs clear. Heart regular rate and rhythm. Ankles without edema       Assessment & Plan:  Impression 1 type 2 diabetes overall good control discussed #2 hypertension  good control discussed maintain same #3 hyperlipidemia good control discussed maintain same #4 status post stroke. Unfortunately having periods of confusion dysarthria and unsteady gait. Ongoing challenge. Patient stays at home with support plan appropriate blood work. Maintain same medications diet exercise discussed patient states no flu shot follow-up as scheduled recommended wellness exam then along with checkup and chronic visit

## 2016-08-04 ENCOUNTER — Ambulatory Visit (INDEPENDENT_AMBULATORY_CARE_PROVIDER_SITE_OTHER): Payer: Medicare Other | Admitting: Family Medicine

## 2016-08-04 ENCOUNTER — Encounter: Payer: Self-pay | Admitting: Family Medicine

## 2016-08-04 VITALS — BP 138/88 | Temp 98.8°F | Ht 67.0 in | Wt 180.0 lb

## 2016-08-04 DIAGNOSIS — J209 Acute bronchitis, unspecified: Secondary | ICD-10-CM

## 2016-08-04 DIAGNOSIS — J329 Chronic sinusitis, unspecified: Secondary | ICD-10-CM

## 2016-08-04 DIAGNOSIS — I639 Cerebral infarction, unspecified: Secondary | ICD-10-CM | POA: Diagnosis not present

## 2016-08-04 MED ORDER — AMOXICILLIN-POT CLAVULANATE 400-57 MG/5ML PO SUSR: ORAL | 0 refills | Status: DC

## 2016-08-04 NOTE — Progress Notes (Signed)
   Subjective:    Patient ID: Seth Neal, male    DOB: 30-Nov-1955, 60 y.o.   MRN: [id number]  HPI    Review of Systems     Objective:   Physical Exam        Assessment & Plan:

## 2016-08-04 NOTE — Progress Notes (Signed)
   Subjective:    Patient ID: Seth Neal, male    DOB: [birth year], 60 y.o.   MRN: [id number]  Cough  This is a new problem. Episode onset: 2 weeks ago. Associated symptoms comments: Diarrhea, vomiting, wheezing, sore throat. Treatments tried: cough drops.   Two weeks cough   Cough and congestion  No fever   No sig headache  Less smoking, throat has been irrit   Review of Systems  Respiratory: Positive for cough.   No vomiting or diarrhea     Objective:   Physical Exam Alert vital stable HET moderate nasal congestion and intermittent bronchial cough during exam no wheezes or crackles       Assessment & Plan:  Impression post viral rhinosinusitis/bronchitis plan antibiotics prescribed. Symptom care discussed warning signs discussed encouraged to stop smoking

## 2016-08-18 ENCOUNTER — Ambulatory Visit (INDEPENDENT_AMBULATORY_CARE_PROVIDER_SITE_OTHER): Payer: Medicare Other | Admitting: Family Medicine

## 2016-08-18 ENCOUNTER — Other Ambulatory Visit: Payer: Self-pay

## 2016-08-18 ENCOUNTER — Other Ambulatory Visit (HOSPITAL_COMMUNITY): Payer: Self-pay | Admitting: Specialist

## 2016-08-18 VITALS — BP 122/78 | Ht 67.0 in | Wt 180.6 lb

## 2016-08-18 DIAGNOSIS — R1310 Dysphagia, unspecified: Secondary | ICD-10-CM

## 2016-08-18 DIAGNOSIS — I639 Cerebral infarction, unspecified: Secondary | ICD-10-CM

## 2016-08-18 DIAGNOSIS — R059 Cough, unspecified: Secondary | ICD-10-CM

## 2016-08-18 DIAGNOSIS — R1319 Other dysphagia: Secondary | ICD-10-CM

## 2016-08-18 DIAGNOSIS — R05 Cough: Secondary | ICD-10-CM | POA: Diagnosis not present

## 2016-08-18 NOTE — Progress Notes (Signed)
   Subjective:    Patient ID: Seth Neal, male    DOB: 11/03/1955, 60 y.o.   MRN: [id number]  HPI Patient arrives wit c/o not eating well for a month. Wife states that he will be hungry and trying to eat but will get to coughing and choking and vomit. Patient returned past couple weeks is not been able to eat well he only eats a few bites then he has difficult time swallowing then he coughs spits and throws it up he is able to drink liquids. He has a strong smoking history as well as multi-infarct dementia Patient also has not had any signs of sickness recently no cough wheezing fever chills.  Review of Systems Patient denies any chest pressure tightness pain shortness breath he has a limited ability to give a history because of his dementia his wife Izora Gala is here with him today she was able to supply much of the history    Objective:   Physical Exam Lungs are clear heart regular neck no masses patient has a very narrow upper airway is very difficult to see if there is a possibility of any type of oropharyngeal cancer   25 minutes was spent with the patient. Greater than half the time was spent in discussion and answering questions and counseling regarding the issues that the patient came in for today. This is concerning for the possibility of cancer versus more strokes.    Assessment & Plan:  Difficulty swallowing-will need a modified barium swallow to look at the oropharyngeal area as well as making sure there is not an esophageal blockage. Referral to Dr. Benjamine Mola We will also do a chest x-ray. Stick with liquids and supplemental drinks for now. Follow-up if progressive troubles.

## 2016-08-18 NOTE — Addendum Note (Signed)
Addended by: Ofilia Neas R on: 08/18/2016 01:52 PM   Modules accepted: Orders

## 2016-08-21 ENCOUNTER — Encounter (HOSPITAL_COMMUNITY): Payer: Self-pay | Admitting: Speech Pathology

## 2016-08-21 ENCOUNTER — Ambulatory Visit (HOSPITAL_COMMUNITY): Payer: Medicare Other | Attending: Family Medicine | Admitting: Speech Pathology

## 2016-08-21 ENCOUNTER — Ambulatory Visit (HOSPITAL_COMMUNITY)
Admission: RE | Admit: 2016-08-21 | Discharge: 2016-08-21 | Disposition: A | Discharge status: Home / Self Care | Payer: Medicare Other | Source: Ambulatory Visit | Attending: Family Medicine | Admitting: Family Medicine

## 2016-08-21 DIAGNOSIS — R1319 Other dysphagia: Secondary | ICD-10-CM | POA: Insufficient documentation

## 2016-08-21 DIAGNOSIS — R1312 Dysphagia, oropharyngeal phase: Secondary | ICD-10-CM | POA: Diagnosis not present

## 2016-08-21 DIAGNOSIS — R05 Cough: Secondary | ICD-10-CM | POA: Diagnosis not present

## 2016-08-21 DIAGNOSIS — R918 Other nonspecific abnormal finding of lung field: Secondary | ICD-10-CM | POA: Diagnosis not present

## 2016-08-21 DIAGNOSIS — R41841 Cognitive communication deficit: Secondary | ICD-10-CM | POA: Diagnosis not present

## 2016-08-21 DIAGNOSIS — I69991 Dysphagia following unspecified cerebrovascular disease: Secondary | ICD-10-CM | POA: Diagnosis not present

## 2016-08-21 NOTE — Therapy (Signed)
Seth Neal, [place], [address] Phone: [phone]   Fax:  [phone]  Modified Barium Swallow  Patient Details  Name: Seth Neal MRN: [id number] Date of Birth: 1955-10-18 No Data Recorded  Encounter Date: 08/21/2016      End of Session - 08/21/16 2015    Visit Number 1   Number of Visits 1   Authorization Type Medicare   SLP Start Time 7106   SLP Stop Time  1425   SLP Time Calculation (min) 40 min   Activity Tolerance Patient tolerated treatment well      Past Medical History:  Diagnosis Date  . Hyperlipidemia   . Hypertension   . Stroke [facility])     Past Surgical History:  Procedure Laterality Date  . COLONOSCOPY  05/17/2012   Procedure: COLONOSCOPY;  Surgeon: Danie Binder, MD;  Location: [facility];  Service: Endoscopy;  Laterality: N/A;  12:30  . None     Head CT 04/2014:  IMPRESSION: 1. No acute intracranial abnormality. 2. Stable remote lacunar infarcts involving the basal ganglia bilaterally. 3. Atrophy with advanced chronic microvascular ischemic disease. There were no vitals filed for this visit.      Subjective Assessment - 08/21/16 2004    Subjective Wife reports that pt has had difficulty swallowing solid foods x1 month   Patient is accompained by: Family member   Special Tests MBSS   Currently in Pain? No/denies             General - 08/21/16 2005      General Information   Date of Onset 07/21/16   HPI Mr. Seth Neal is a 60 yo male who was referred by Dr. Sallee Lange for MBSS due to pt with h/o dysphagia to solid foods x 1 month. Pt with history of previous strokes (basal ganglia infarcts and frontal sinus disease) with resulting cognitive deficits and dysarthria.    Type of Study MBS-Modified Barium Swallow Study   Previous Swallow Assessment None on record   Diet Prior to this Study Regular;Thin liquids   Temperature Spikes Noted No   Respiratory Status Room  air   History of Recent Intubation No   Behavior/Cognition Alert;Requires cueing   Oral Cavity Assessment Within Functional Limits   Oral Care Completed by SLP No   Oral Cavity - Dentition Poor condition   Vision Functional for self feeding   Self-Feeding Abilities Able to feed self   Patient Positioning Upright in chair   Baseline Vocal Quality Low vocal intensity   Volitional Cough Strong   Volitional Swallow Able to elicit   Anatomy Within functional limits   Pharyngeal Secretions Not observed secondary MBS            Oral Preparation/Oral Phase - 08/21/16 2007      Oral Preparation/Oral Phase   Oral Phase Impaired     Oral - Nectar   Oral - Nectar Teaspoon Weak ligual manipulation;Decreased bolus cohesion   Oral - Nectar Cup Piecemeal swallowing     Oral - Thin   Oral - Thin Cup Piecemeal swallowing   Oral - Thin Straw Piecemeal swallowing     Oral - Solids   Oral - Puree Weak ligual manipulation;Decreased bolus cohesion;Delayed A-P transit;Piecemeal swallowing   Oral - Regular Weak ligual manipulation;Imparied mastication;Piecemeal swallowing;Delayed A-P transit;Decreased bolus cohesion;Oral residue   Oral - Pill Delayed A-P transit;Weak ligual manipulation     Electrical stimulation - Oral Phase   Was  Electrical Stimulation Used No          Pharyngeal Phase - 08/21/16 2009      Pharyngeal Phase   Pharyngeal Phase Impaired     Pharyngeal - Nectar   Pharyngeal- Nectar Teaspoon Delayed swallow initiation;Swallow initiation at pyriform sinus;Penetration/Aspiration before swallow;Trace aspiration   Pharyngeal Material enters airway, passes BELOW cords and not ejected out despite cough attempt by patient   Pharyngeal- Nectar Cup Delayed swallow initiation;Swallow initiation at pyriform sinus     Pharyngeal - Thin   Pharyngeal- Thin Cup Delayed swallow initiation;Swallow initiation at pyriform sinus   Pharyngeal- Thin Straw Delayed swallow initiation;Swallow  initiation at pyriform sinus;Penetration/Aspiration during swallow;Pharyngeal residue - pyriform   Pharyngeal Material does not enter airway;Material enters airway, remains ABOVE vocal cords then ejected out;Material enters airway, remains ABOVE vocal cords and not ejected out     Pharyngeal - Solids   Pharyngeal- Puree Delayed swallow initiation;Swallow initiation at vallecula   Pharyngeal- Regular Delayed swallow initiation-vallecula   Pharyngeal- Pill Swallow initiation at vallecula     Electrical Stimulation - Pharyngeal Phase   Was Electrical Stimulation Used No          Cricopharyngeal Phase - 08/21/16 2013      Cervical Esophageal Phase   Cervical Esophageal Phase Within functional limits           Plan - 08/21/16 2016    Clinical Impression Statement Pt seen for MBSS while upright in the lateral position. Pt assessed with barium tinged nectar, thin, puree, graham crackers, and barium tablet with thin. Pt initially given teaspoon of nectar-thick liquids, which spilled over base of tongue and into laryngeal vestibule resulting in aspiration with immediate cough before the swallow was triggered. Not all of aspirated material was expelled. This was the only time pt aspirated during the study. Pt presents with moderate oral phase dysphagia and mild pharyngeal phase dysphagia characterized by weak lingual manipulation, decreased bolus formation, piecemeal deglutition, decreased anterior/posterior propulsion orally, resulting in premature spillage into pharynx with delay in swallow trigger and penetration of thin liquids when taking large and/or sequential sips (straw included) without aspiration. When pt took sequential straw sips, the epiglottis did not completely invert until his final swallow (allowing for penetration due to decreased laryngeal vestibule closure). Pt with trace/mild lateral channel pooling after the swallow with sequential straw sips thin. Pt with piecemeal deglutition  across textures and consistencies due to oral phase dysphagia. Pt with no pharyngeal residue with purees or solids, despite pt/wife stating that pt has more difficulty with solids. Pt's uvula twitches at times and appears to have poor coordination with respiration and swallow. Pt also with significant dysarthria and it is difficult to determine if this is baseline (from previous stroke four years ago) or if this has gotten worse. Recommend ENT consult (already arranged to through PCP) to visualize oropharyngeal anatomy and also recommend trial outpatient speech/swallowing therapy. Will obtain order from MD if in agreement.   Speech Therapy Frequency 2x / week   Duration 4 weeks   Treatment/Interventions Aspiration precaution training;Diet toleration management by SLP;Compensatory techniques;Trials of upgraded texture/liquids;SLP instruction and feedback;Compensatory strategies;Patient/family education;Cueing hierarchy   Potential to Achieve Goals Fair   Potential Considerations Ability to learn/carryover information;Severity of impairments   Consulted and Agree with Plan of Care Patient;Family member/caregiver      Patient will benefit from skilled therapeutic intervention in order to improve the following deficits and impairments:   Dysphagia, oropharyngeal phase      G-Codes -  08/21/16 2018    Functional Assessment Tool Used MBSS; clinical judgment   Functional Limitations Swallowing   Swallow Current Status (G8185) At least 20 percent but less than 40 percent impaired, limited or restricted   Swallow Goal Status (U3149) At least 1 percent but less than 20 percent impaired, limited or restricted   Swallow Discharge Status [phone]) At least 20 percent but less than 40 percent impaired, limited or restricted          Recommendations/Treatment - 08/21/16 2014      Swallow Evaluation Recommendations   Recommended Consults Consider ENT evaluation   SLP Diet Recommendations Dysphagia 3  (mechanical soft);Thin   Liquid Administration via Cup;No straw   Medication Administration Crushed with puree   Supervision Patient able to self feed   Compensations Slow rate   Postural Changes Seated upright at 90 degrees;Remain upright for at least 30 minutes after feeds/meals          Prognosis - 08/21/16 2014      Prognosis   Prognosis for Safe Diet Advancement Fair   Barriers to Reach Goals Severity of deficits;Cognitive deficits     Individuals Consulted   Consulted and Agree with Results and Recommendations Patient;Family member/caregiver   Family Member Consulted Wife   Report Sent to  Referring physician      Problem List Patient Active Problem List   Diagnosis Date Noted  . Diabetes ([place]) 12/04/2015  . Impaired fasting glucose 05/31/2015  . Hyperlipidemia LDL goal <70 05/09/2014  . Essential hypertension, benign 07/09/2013  . Incontinence 07/09/2013  . Multiple lacunar infarcts ([place]) 07/09/2013  . Dementia with behavioral disturbance 07/09/2013  . Prostate hypertrophy 07/09/2013  . Obstructive sleep apnea 07/09/2013  . Change in bowel habits 05/09/2012   Thank you,  Genene Churn, [place]  [facility] 08/21/2016, 8:19 PM  [place], [place], [address] Phone: [phone]   Fax:  [phone]  Name: Seth Neal MRN: [id number] Date of Birth: Dec 04, 1955

## 2016-08-24 ENCOUNTER — Telehealth: Payer: Self-pay | Admitting: Family Medicine

## 2016-08-24 ENCOUNTER — Telehealth: Payer: Self-pay

## 2016-08-24 ENCOUNTER — Encounter: Payer: Self-pay | Admitting: Family Medicine

## 2016-08-24 ENCOUNTER — Ambulatory Visit (INDEPENDENT_AMBULATORY_CARE_PROVIDER_SITE_OTHER): Payer: Medicare Other | Admitting: Otolaryngology

## 2016-08-24 DIAGNOSIS — R1310 Dysphagia, unspecified: Secondary | ICD-10-CM

## 2016-08-24 DIAGNOSIS — R1312 Dysphagia, oropharyngeal phase: Secondary | ICD-10-CM | POA: Diagnosis not present

## 2016-08-24 DIAGNOSIS — R070 Pain in throat: Secondary | ICD-10-CM | POA: Diagnosis not present

## 2016-08-24 NOTE — Telephone Encounter (Signed)
Please forward to Dr. Braulio Conte is his ongoing patient. I happened to be covering for him on the day he was seen thank you

## 2016-08-24 NOTE — Addendum Note (Signed)
Addended by: Jesusita Oka on: 08/24/2016 11:26 AM   Modules accepted: Orders

## 2016-08-24 NOTE — Telephone Encounter (Signed)
Speech therapy recommends that this patient be set up for speech therapy evaluation and treatment for dysphagia with oral pharyngeal component with difficulty swallowing. Please initiate this referral.(I cannot tell if this is already been done thank you)

## 2016-08-24 NOTE — Telephone Encounter (Signed)
Notified daughter referral has been initiated in the system.

## 2016-08-24 NOTE — Telephone Encounter (Signed)
We will do the [facility] after her assessment, using her assessment as guidance,  if she officially determines ot needs to be elsewhere

## 2016-08-24 NOTE — Telephone Encounter (Signed)
Billey Co from [facility] [employer]) called because she was contacted by the patient's brother concerned about the patient. Patient is not eating, has completely stopped smoking and is being left alone at home during the day. I notified Colletta Maryland that Dr. Nicki Reaper is aware of the patient not eating and that he has stopped smoking so abruptly. Dr. Nicki Reaper is treating patient accordingly and he has appointment with specialist today and referral is being processed for speech therapy. Colletta Maryland states that she is glad to hear that patient is being treated but she will have to address the family about the patient being left home alone and he has dementia. She wants Dr. Nicki Reaper to prepare a [facility] for her because she believes that this patient is going to have to be placed in a facility.

## 2016-08-25 NOTE — Telephone Encounter (Signed)
Closed today due to veterans day. Call on Monday please.

## 2016-08-28 NOTE — Telephone Encounter (Signed)
Called Billey Co at [facility] and left message 08/28/16

## 2016-08-28 NOTE — Telephone Encounter (Signed)
Spoke with Colletta Maryland with [facility] [employer] and she stated that she can send [place] the Mini Mental exam results in which patient scored a 6 out of 30. She stated that the patient is left home during long periods of the day in his own waste, she does not believe that he can prepare meals for himself. She states that the assistance he could receive whether that be in a nursing home or at home assistance would be based off of his financial status. She currently does not have an official assessment completed on patient because it usually takes 30 days however she will try and speed up the process due to patient being in need. Once again she stated at this time she can only fax over his mini mental status exam results.

## 2016-08-29 NOTE — Telephone Encounter (Signed)
FL2 form filled out. Form in your basket.

## 2016-08-29 NOTE — Telephone Encounter (Signed)
Seth Neal with Adult [employer] called back today. Mini Mental exam results were faxed over (see folder in office). Seth Neal is inquiring on when will the [facility] form be completed. Please advise?

## 2016-08-29 NOTE — Telephone Encounter (Signed)
Nurses we need an fl2 form filled out with current meds, i'll do rest

## 2016-08-30 NOTE — Telephone Encounter (Signed)
done

## 2016-08-30 NOTE — Telephone Encounter (Signed)
From faxed to the number provided at the bottom of the form.

## 2016-09-01 NOTE — Telephone Encounter (Signed)
Seth Neal with Adult [facility] called back stating that they need the form also faxed locally. Form no longer in our office it was sent to the [facility] to be scanned in patient's chart. FL2 form in Dr.Steve's offices. Medication filled out on the bottom of the form.  Fax completed form to [phone]

## 2016-09-04 ENCOUNTER — Telehealth: Payer: Self-pay | Admitting: Family Medicine

## 2016-09-04 NOTE — Telephone Encounter (Signed)
Left message on voicemail to return call (needs clarifying on exactly what she doesn't understand about the form)

## 2016-09-04 NOTE — Telephone Encounter (Signed)
Billey Co with Social Services handling patient case has question about FL2 form you filled out  and if the patient harmed himself or falls a lot that part wasn't clear on form she stated so she can place him in a home care place needs explanation on that part of form. Call [phone] ext.[phone]

## 2016-09-06 ENCOUNTER — Telehealth: Payer: Self-pay | Admitting: Family Medicine

## 2016-09-06 NOTE — Telephone Encounter (Signed)
Seth Neal with [facility] [employer] [facility] needs Dr. Nicki Reaper to clarify pt's FL2  He marked that the patient is injurious to himself and the facility is asking APS to clarify  Please advise  Seth Neal - [phone], ext [place] Cell# [phone]

## 2016-09-06 NOTE — Telephone Encounter (Signed)
Injurious to himself in the sense that he is not able to safely stay at home alone with his level of dementia and physical disability fronm the prior strokes, not intending to harm himself, no problem with that

## 2016-09-06 NOTE — Telephone Encounter (Signed)
Spoke with Seth Neal with [facility] [employer] and informed her per Dr.Steve Luking- Injurious to himself in the sense that he is not able to safely stay at home alone with his level of dementia and physical disability from the prior strokes, not intending to harm himself, no problem with that. Seth Neal verbalized understanding.

## 2016-09-06 NOTE — Telephone Encounter (Signed)
Left message return call 09/06/16

## 2016-09-06 NOTE — Telephone Encounter (Signed)
Dr. Richardson Landry should be able to clarify this upon his return on Friday

## 2016-09-11 ENCOUNTER — Ambulatory Visit (HOSPITAL_COMMUNITY): Payer: Medicare Other

## 2016-09-11 ENCOUNTER — Encounter (HOSPITAL_COMMUNITY): Payer: Self-pay

## 2016-09-11 DIAGNOSIS — R41841 Cognitive communication deficit: Secondary | ICD-10-CM

## 2016-09-11 DIAGNOSIS — R1312 Dysphagia, oropharyngeal phase: Secondary | ICD-10-CM

## 2016-09-11 NOTE — Therapy (Addendum)
[place], [place], [address] Phone: [phone]   Fax:  [phone]  Speech Language Pathology Evaluation  Patient Details  Name: Seth Neal MRN: [id number] Date of Birth: December 26, 1955 Referring Provider: Dr. Sallee Lange  Encounter Date: 09/11/2016      End of Session - 09/11/16 1813    Visit Number 1   Number of Visits 8   Date for SLP Re-Evaluation 11/06/16   Authorization Type Medicare   SLP Start Time 1653   SLP Stop Time  1740   SLP Time Calculation (min) 47 min   Activity Tolerance Patient tolerated treatment well      Past Medical History:  Diagnosis Date  . Hyperlipidemia   . Hypertension   . Stroke [facility])     Past Surgical History:  Procedure Laterality Date  . COLONOSCOPY  05/17/2012   Procedure: COLONOSCOPY;  Surgeon: Danie Binder, MD;  Location: [facility];  Service: Endoscopy;  Laterality: N/A;  12:30  . None      There were no vitals filed for this visit.      Subjective Assessment - 09/11/16 1809    Subjective per wife "he doesn't tell me things, and he doesn't like to eat much"    Patient is accompained by: Family member   Special Tests SLUMS , bedside evaluation of swallowing    Currently in Pain? No/denies   Multiple Pain Sites No            SLP Evaluation [facility] - 09/11/16 1816      SLP Visit Information   SLP Received On 09/11/16   Referring Provider Dr. Sallee Lange   Onset Date 07-21-16   Medical Diagnosis dysphagia, oropharyngeal      Subjective   Subjective " he has trouble with eting"    Patient/Family Stated Goal safe swallowing      Pain Assessment   Currently in Pain? No/denies     General Information   HPI 60 y/o male with HTN, hyperlipidemia, and stroke (old stroke).    Behavioral/Cognition impaired cognition    Mobility Status functional      Prior Functional Status   Cognitive/Linguistic Baseline Baseline deficits   Baseline deficit  details dysarthria   Type of Home House    Lives With Spouse   Available Support Family  son helps  some   Education high school    Vocation Retired     Associate Professor   Overall Cognitive Status Impaired/Different from baseline   Area of Impairment Following commands   Following Commands Follows one step commands consistently   Attention --  WFL   Memory Impaired   Memory Impairment Decreased short term memory   Decreased Short Term Memory Verbal basic   Awareness Impaired   Awareness Impairment Intellectual impairment   Problem Solving Impaired   Executive Function Reasoning   Reasoning Impaired   Reasoning Impairment Verbal basic   Behaviors Other (comment)  cooperative      Auditory Comprehension   Overall Auditory Comprehension Impaired   Yes/No Questions Within Functional Limits   Commands Impaired   One Step Basic Commands 0-24% accurate   Conversation Simple   EffectiveTechniques Pausing     Visual Recognition/Discrimination   Discrimination Within Function Limits     Reading Comprehension   Reading Status Impaired   Word level 0-25% accurate     Expression   Primary Mode of Expression Verbal     Verbal Expression  Overall Verbal Expression Impaired   Initiation No impairment   Automatic Speech Name   Level of Generative/Spontaneous Verbalization Word   Repetition No impairment   Naming Impairment   Responsive 26-50% accurate   Confrontation 75-100% accurate     Oral Motor/Sensory Function   Overall Oral Motor/Sensory Function Impaired   Labial ROM Reduced right;Reduced left   Labial Symmetry Abnormal symmetry right;Abnormal symmetry left   Lingual ROM Reduced right;Reduced left     Motor Speech   Overall Motor Speech Impaired at baseline     Standardized Assessments   Standardized Assessments  Other Assessment  SLUMS                          SLP Education - 09/11/16 1811    Education provided Yes   Education Details provided  dsyphagia 3 diet handout and safe swallowing handout    Person(s) Educated Spouse;Patient   Methods Explanation;Demonstration   Comprehension Verbalized understanding          SLP Short Term Goals - 09/11/16 1834      SLP SHORT TERM GOAL #1   Title Pt. will use safe swalowing strategies and precautions with cueing from caregiver with accuracy in 9/10 trials.    Baseline 50% accuracy    Time 8   Period Weeks   Status New     SLP SHORT TERM GOAL #2   Title Pt. will consume thin liquid and dysphagia 3 diet consistency with no s/s of aspiration across 4 sessions with caregiver use of swallowing strategies.    Baseline no overt s/s of aspiration - 1 session    Time 8   Period Weeks   Status New     SLP SHORT TERM GOAL #3   Title Caregiver will demonstrate understanding of appropriate, safe consistencies (dysphagia 3 diet) with 90% accuracy and no cues.    Baseline less than 50% accuracy    Time 8   Period Weeks   Status New     SLP SHORT TERM GOAL #4   Title Pt. will express functional  wants and needs (example: pain, bathroom) by gestures, pointing to picutes or  verbalizing with accuracy in 8/10 trials.    Baseline less than 50% accuracy    Time 8   Period Weeks   Status New          SLP Long Term Goals - 09/11/16 1841      SLP LONG TERM GOAL #1   Title Pt. will safely swallow solid and liquids with no overt s/s of aspiration and he will express wants and needs for functionial situations with accuracy in 8/10 trials.    Baseline approximately 50% accuracy    Time 8   Period Weeks   Status New          Plan - 09/11/16 1816    Clinical Impression Statement This 60 y/o male presents to ST with deficits in swallowing  and cognitive-lingusitic abilities. His wife states that his swallowing difficulty emerged aout 62 months ago. He was referred to outpatient ST by Dr. Sallee Lange following a MBSS from 08-21-16. MBSS indicates decreased bolus cohesion, decreased A-P  transfer, difficulty bolus manipulation, and impaired mastication (noted the he is missing some teeth). MBSS recommended thin via cup or spoon and dysphagia 3 diet with aspiration precautions.  During today's evaluation, he consumed cracker with applesauce and soft peaches with prolonged mastication and decreased bolus cohesioni but no  overt s/s of aspiration with solids or thin liquids via pt. administered cup sip. Noted difficulty with basic direction following during oral mechanism exam-lingual movements impaired. Pt. has dysarthria as a result of a previous stroke (4 years ago). Administered SLUMS to assess cognition, and  he scored 3/30, which is in the "dementia" range. He answered yes/no questions with 100% accuracy and identified pictures with min verbal cues. He was unable to formulate and express wants and needs with cueing when provided with common scenarios. Plan to see pt. 1x/wk for 8 weeks to target above deficits, specifically  decreasing his risk of aspiration, expressing wants and needs, and providing caregiver education re: safe swallowing and strategies to enhance communication of wants and needs. ST also recommends ENT consult  to visualize oralopharyngeal anatomy.    Speech Therapy Frequency 1x /week   Duration Other (comment)  8 weeks    Treatment/Interventions Aspiration precaution training;Diet toleration management by SLP;Multimodal communcation approach;Patient/family education;Compensatory strategies;Compensatory techniques;Language facilitation;SLP instruction and feedback   Potential to Achieve Goals Good   Potential Considerations Co-morbidities;Severity of impairments   SLP Home Exercise Plan provided educational material for swallowing safety    Consulted and Agree with Plan of Care Patient;Family member/caregiver   Family Member Consulted wife "Izora Gala"       Patient will benefit from skilled therapeutic intervention in order to improve the following deficits and  impairments:   Dysphagia, oropharyngeal phase - Plan: SLP PLAN OF CARE CERT/RE-CERT  Cognitive communication deficit - Plan: SLP PLAN OF CARE CERT/RE-CERT      G-Codes - 47/09/62 1844    Functional Assessment Tool Used clinical assessment and judgement    Functional Limitations Swallowing   Swallow Current Status (E3662) At least 20 percent but less than 40 percent impaired, limited or restricted   Swallow Goal Status (H4765) At least 1 percent but less than 20 percent impaired, limited or restricted      Problem List Patient Active Problem List   Diagnosis Date Noted  . Diabetes ([place]) 12/04/2015  . Impaired fasting glucose 05/31/2015  . Hyperlipidemia LDL goal <70 05/09/2014  . Essential hypertension, benign 07/09/2013  . Incontinence 07/09/2013  . Multiple lacunar infarcts ([place]) 07/09/2013  . Dementia with behavioral disturbance 07/09/2013  . Prostate hypertrophy 07/09/2013  . Obstructive sleep apnea 07/09/2013  . Change in bowel habits 05/09/2012     Thank you,   Renato Gails. Megan Salon, [place], CCC-SLP  Speech-Language Pathologist [phone]   SPEECH THERAPY DISCHARGE SUMMARY  Visits from Start of Care: 1 evaluation   Current functional level related to goals / functional outcomes: Swallowing - [id number]   Remaining deficits: Pt. Did not make progress, as he was only seen for an evaluation. Pt. Has deficits in swallowing, cognition, and communication   Pt. Is being discharged due to admission to skilled nsg. Facility on 09/14/16 per daughter's report.  Education / Equipment: Educated regarding safe swallowing precautions and diet recommendations was provided via handout  Plan: Patient agrees to discharge.  Patient goals were not met. Patient is being discharged due to the patient's request.  ?????       Luther Redo 09/18/2016, 10:32 AM   [facility] [address] [place], [place], [address] Phone: [phone]    Fax:  [phone]  Name: Karol Liendo MRN: [id number] Date of Birth: 12-Nov-1955

## 2016-09-13 ENCOUNTER — Ambulatory Visit (HOSPITAL_COMMUNITY): Payer: Medicare Other

## 2016-09-13 ENCOUNTER — Telehealth (HOSPITAL_COMMUNITY): Payer: Self-pay

## 2016-09-13 NOTE — Telephone Encounter (Signed)
Called pt spoke with daughter she will text her mother at work and let us know if they wanted to cx today or keep it. When they call ask if they only want to keep the Monday appointment due to wife's work schedule.Nf 09/13/16 1xwk for 8weeks per AC. NF 09/13/16 Ask wife to give [place] a call if she is going to be late, but try to keep appointments as scheduled. NF 09/12/16

## 2016-09-13 NOTE — Telephone Encounter (Signed)
Talked with wife she states he will be admitted to [facility] and wife agreed to keep Monday's apptment just in case he doesn't get in [facility] tomorrow. N

## 2016-09-14 ENCOUNTER — Telehealth: Payer: Self-pay | Admitting: Family Medicine

## 2016-09-14 ENCOUNTER — Ambulatory Visit (INDEPENDENT_AMBULATORY_CARE_PROVIDER_SITE_OTHER): Payer: Medicare Other | Admitting: Family Medicine

## 2016-09-14 DIAGNOSIS — R1310 Dysphagia, unspecified: Secondary | ICD-10-CM

## 2016-09-14 DIAGNOSIS — I639 Cerebral infarction, unspecified: Secondary | ICD-10-CM | POA: Diagnosis not present

## 2016-09-14 DIAGNOSIS — F0151 Vascular dementia with behavioral disturbance: Secondary | ICD-10-CM | POA: Diagnosis not present

## 2016-09-14 DIAGNOSIS — F01518 Vascular dementia, unspecified severity, with other behavioral disturbance: Secondary | ICD-10-CM

## 2016-09-14 DIAGNOSIS — I6381 Other cerebral infarction due to occlusion or stenosis of small artery: Secondary | ICD-10-CM

## 2016-09-14 NOTE — Telephone Encounter (Signed)
Colletta Maryland with Adult Protective Services called asking if you feel that the patient is in need of 24 hour around the clock care. They have a place for patient today at [facility] but family is saying that they believe that patient just needs care for a few hours and they want medicare to pay for half and they pay for half. Colletta Maryland states Medicare will not do this and she feels that the patient needs care around the clock for his safety. She states she needs something in writing from PCP stating that patient needs 24 hour care so that the patient can take the opening at [facility]. Please advise?  Colletta Maryland can be reached at [phone] Ext [phone] Or personal mobile [phone]

## 2016-09-14 NOTE — Progress Notes (Signed)
Patient's daughter presents for urgent an important discussion regarding her father. He's had dementia from multiple strokes. This is becoming worse.  A couple years ago he had to take driving away from him. Then we had to force and not smoke.  During the day the patient has been staying by himself at home. There have been family members checking in on him but often he is gone hours without assistance.  This never was an ideal situation. And has become frankly untenable now that the patient has more severe dementia.  Another family member visited. She put in a complaint with Adult Protective Services. The Adult Protective Services folks came out assess the patient. And found that he was in a completely unsafe situation to be by himself.  They did a dementia MMSE score which revealed 7 out of 30.  This is been of course very upsetting to the family who were hoping the keep her follow other at home. Also upsetting apparently to the patient's spouse. She has had to continue working during the day so she has not been able to sit with the patient during the day.  Very long discussion with the patient's daughter today in this regard.  Then I was called on the phone by social services to clarify the patient situation.  They have a bed available today as long as our FLT was completely filled out. He also requested that I make a statement that at this time patient is unsafe to stay by himself 24 7, which I think is entirely accurate. Therefore I did make this statement.  Family is hopeful that if he is forced to go into nursing center they can somehow obtain home support and considers to keep them or return him to home. 30 minutes spent in discussion with the daughter and Adult Protective Services about this very challenging situation

## 2016-09-15 ENCOUNTER — Telehealth (HOSPITAL_COMMUNITY): Payer: Self-pay

## 2016-09-15 DIAGNOSIS — Z8673 Personal history of transient ischemic attack (TIA), and cerebral infarction without residual deficits: Secondary | ICD-10-CM | POA: Diagnosis not present

## 2016-09-15 DIAGNOSIS — R41841 Cognitive communication deficit: Secondary | ICD-10-CM | POA: Diagnosis not present

## 2016-09-15 DIAGNOSIS — F0390 Unspecified dementia without behavioral disturbance: Secondary | ICD-10-CM | POA: Diagnosis not present

## 2016-09-15 DIAGNOSIS — R4701 Aphasia: Secondary | ICD-10-CM | POA: Diagnosis not present

## 2016-09-15 DIAGNOSIS — R2681 Unsteadiness on feet: Secondary | ICD-10-CM | POA: Diagnosis not present

## 2016-09-15 DIAGNOSIS — R1312 Dysphagia, oropharyngeal phase: Secondary | ICD-10-CM | POA: Diagnosis not present

## 2016-09-15 DIAGNOSIS — R471 Dysarthria and anarthria: Secondary | ICD-10-CM | POA: Diagnosis not present

## 2016-09-15 NOTE — Telephone Encounter (Signed)
Talked with pt's daughter he was admitted to [facility] yesterday and she requested that we D/c her dad from our office today. NF 09/15/16

## 2016-09-17 DIAGNOSIS — R471 Dysarthria and anarthria: Secondary | ICD-10-CM | POA: Diagnosis not present

## 2016-09-17 DIAGNOSIS — F0390 Unspecified dementia without behavioral disturbance: Secondary | ICD-10-CM | POA: Diagnosis not present

## 2016-09-17 DIAGNOSIS — Z8673 Personal history of transient ischemic attack (TIA), and cerebral infarction without residual deficits: Secondary | ICD-10-CM | POA: Diagnosis not present

## 2016-09-17 DIAGNOSIS — R1312 Dysphagia, oropharyngeal phase: Secondary | ICD-10-CM | POA: Diagnosis not present

## 2016-09-17 DIAGNOSIS — R2681 Unsteadiness on feet: Secondary | ICD-10-CM | POA: Diagnosis not present

## 2016-09-17 DIAGNOSIS — R41841 Cognitive communication deficit: Secondary | ICD-10-CM | POA: Diagnosis not present

## 2016-09-18 ENCOUNTER — Ambulatory Visit (HOSPITAL_COMMUNITY): Payer: Medicare Other

## 2016-09-18 DIAGNOSIS — R1312 Dysphagia, oropharyngeal phase: Secondary | ICD-10-CM | POA: Diagnosis not present

## 2016-09-18 DIAGNOSIS — Z8673 Personal history of transient ischemic attack (TIA), and cerebral infarction without residual deficits: Secondary | ICD-10-CM | POA: Diagnosis not present

## 2016-09-18 DIAGNOSIS — R2681 Unsteadiness on feet: Secondary | ICD-10-CM | POA: Diagnosis not present

## 2016-09-18 DIAGNOSIS — R471 Dysarthria and anarthria: Secondary | ICD-10-CM | POA: Diagnosis not present

## 2016-09-18 DIAGNOSIS — R41841 Cognitive communication deficit: Secondary | ICD-10-CM | POA: Diagnosis not present

## 2016-09-18 DIAGNOSIS — F0390 Unspecified dementia without behavioral disturbance: Secondary | ICD-10-CM | POA: Diagnosis not present

## 2016-09-19 DIAGNOSIS — R41841 Cognitive communication deficit: Secondary | ICD-10-CM | POA: Diagnosis not present

## 2016-09-19 DIAGNOSIS — R1312 Dysphagia, oropharyngeal phase: Secondary | ICD-10-CM | POA: Diagnosis not present

## 2016-09-19 DIAGNOSIS — R2681 Unsteadiness on feet: Secondary | ICD-10-CM | POA: Diagnosis not present

## 2016-09-19 DIAGNOSIS — Z8673 Personal history of transient ischemic attack (TIA), and cerebral infarction without residual deficits: Secondary | ICD-10-CM | POA: Diagnosis not present

## 2016-09-19 DIAGNOSIS — R471 Dysarthria and anarthria: Secondary | ICD-10-CM | POA: Diagnosis not present

## 2016-09-19 DIAGNOSIS — F0390 Unspecified dementia without behavioral disturbance: Secondary | ICD-10-CM | POA: Diagnosis not present

## 2016-09-20 ENCOUNTER — Encounter (HOSPITAL_COMMUNITY): Payer: Medicare Other

## 2016-09-20 DIAGNOSIS — R471 Dysarthria and anarthria: Secondary | ICD-10-CM | POA: Diagnosis not present

## 2016-09-20 DIAGNOSIS — R41841 Cognitive communication deficit: Secondary | ICD-10-CM | POA: Diagnosis not present

## 2016-09-20 DIAGNOSIS — F0390 Unspecified dementia without behavioral disturbance: Secondary | ICD-10-CM | POA: Diagnosis not present

## 2016-09-20 DIAGNOSIS — R1312 Dysphagia, oropharyngeal phase: Secondary | ICD-10-CM | POA: Diagnosis not present

## 2016-09-20 DIAGNOSIS — R2681 Unsteadiness on feet: Secondary | ICD-10-CM | POA: Diagnosis not present

## 2016-09-20 DIAGNOSIS — Z8673 Personal history of transient ischemic attack (TIA), and cerebral infarction without residual deficits: Secondary | ICD-10-CM | POA: Diagnosis not present

## 2016-09-21 DIAGNOSIS — R1312 Dysphagia, oropharyngeal phase: Secondary | ICD-10-CM | POA: Diagnosis not present

## 2016-09-21 DIAGNOSIS — R471 Dysarthria and anarthria: Secondary | ICD-10-CM | POA: Diagnosis not present

## 2016-09-21 DIAGNOSIS — R2681 Unsteadiness on feet: Secondary | ICD-10-CM | POA: Diagnosis not present

## 2016-09-21 DIAGNOSIS — R41841 Cognitive communication deficit: Secondary | ICD-10-CM | POA: Diagnosis not present

## 2016-09-21 DIAGNOSIS — F0390 Unspecified dementia without behavioral disturbance: Secondary | ICD-10-CM | POA: Diagnosis not present

## 2016-09-21 DIAGNOSIS — Z8673 Personal history of transient ischemic attack (TIA), and cerebral infarction without residual deficits: Secondary | ICD-10-CM | POA: Diagnosis not present

## 2016-09-22 DIAGNOSIS — F0390 Unspecified dementia without behavioral disturbance: Secondary | ICD-10-CM | POA: Diagnosis not present

## 2016-09-22 DIAGNOSIS — R1312 Dysphagia, oropharyngeal phase: Secondary | ICD-10-CM | POA: Diagnosis not present

## 2016-09-22 DIAGNOSIS — R41841 Cognitive communication deficit: Secondary | ICD-10-CM | POA: Diagnosis not present

## 2016-09-22 DIAGNOSIS — R4701 Aphasia: Secondary | ICD-10-CM | POA: Diagnosis not present

## 2016-09-22 DIAGNOSIS — R2681 Unsteadiness on feet: Secondary | ICD-10-CM | POA: Diagnosis not present

## 2016-09-22 DIAGNOSIS — F0150 Vascular dementia without behavioral disturbance: Secondary | ICD-10-CM | POA: Diagnosis not present

## 2016-09-22 DIAGNOSIS — Z8673 Personal history of transient ischemic attack (TIA), and cerebral infarction without residual deficits: Secondary | ICD-10-CM | POA: Diagnosis not present

## 2016-09-22 DIAGNOSIS — R32 Unspecified urinary incontinence: Secondary | ICD-10-CM | POA: Diagnosis not present

## 2016-09-22 DIAGNOSIS — R5381 Other malaise: Secondary | ICD-10-CM | POA: Diagnosis not present

## 2016-09-22 DIAGNOSIS — R471 Dysarthria and anarthria: Secondary | ICD-10-CM | POA: Diagnosis not present

## 2016-09-25 ENCOUNTER — Encounter (HOSPITAL_COMMUNITY): Payer: Medicare Other

## 2016-09-25 DIAGNOSIS — R2681 Unsteadiness on feet: Secondary | ICD-10-CM | POA: Diagnosis not present

## 2016-09-25 DIAGNOSIS — R1312 Dysphagia, oropharyngeal phase: Secondary | ICD-10-CM | POA: Diagnosis not present

## 2016-09-25 DIAGNOSIS — Z8673 Personal history of transient ischemic attack (TIA), and cerebral infarction without residual deficits: Secondary | ICD-10-CM | POA: Diagnosis not present

## 2016-09-25 DIAGNOSIS — F0390 Unspecified dementia without behavioral disturbance: Secondary | ICD-10-CM | POA: Diagnosis not present

## 2016-09-25 DIAGNOSIS — R471 Dysarthria and anarthria: Secondary | ICD-10-CM | POA: Diagnosis not present

## 2016-09-25 DIAGNOSIS — R41841 Cognitive communication deficit: Secondary | ICD-10-CM | POA: Diagnosis not present

## 2016-09-26 DIAGNOSIS — R2681 Unsteadiness on feet: Secondary | ICD-10-CM | POA: Diagnosis not present

## 2016-09-26 DIAGNOSIS — R41841 Cognitive communication deficit: Secondary | ICD-10-CM | POA: Diagnosis not present

## 2016-09-26 DIAGNOSIS — Z8673 Personal history of transient ischemic attack (TIA), and cerebral infarction without residual deficits: Secondary | ICD-10-CM | POA: Diagnosis not present

## 2016-09-26 DIAGNOSIS — R1312 Dysphagia, oropharyngeal phase: Secondary | ICD-10-CM | POA: Diagnosis not present

## 2016-09-26 DIAGNOSIS — F0390 Unspecified dementia without behavioral disturbance: Secondary | ICD-10-CM | POA: Diagnosis not present

## 2016-09-26 DIAGNOSIS — R471 Dysarthria and anarthria: Secondary | ICD-10-CM | POA: Diagnosis not present

## 2016-09-27 ENCOUNTER — Encounter (HOSPITAL_COMMUNITY): Payer: Medicare Other

## 2016-09-27 DIAGNOSIS — R4701 Aphasia: Secondary | ICD-10-CM | POA: Diagnosis not present

## 2016-09-27 DIAGNOSIS — E8881 Metabolic syndrome: Secondary | ICD-10-CM | POA: Diagnosis not present

## 2016-09-27 DIAGNOSIS — R2681 Unsteadiness on feet: Secondary | ICD-10-CM | POA: Diagnosis not present

## 2016-09-27 DIAGNOSIS — R41841 Cognitive communication deficit: Secondary | ICD-10-CM | POA: Diagnosis not present

## 2016-09-27 DIAGNOSIS — F0390 Unspecified dementia without behavioral disturbance: Secondary | ICD-10-CM | POA: Diagnosis not present

## 2016-09-27 DIAGNOSIS — R471 Dysarthria and anarthria: Secondary | ICD-10-CM | POA: Diagnosis not present

## 2016-09-27 DIAGNOSIS — F0150 Vascular dementia without behavioral disturbance: Secondary | ICD-10-CM | POA: Diagnosis not present

## 2016-09-27 DIAGNOSIS — I10 Essential (primary) hypertension: Secondary | ICD-10-CM | POA: Diagnosis not present

## 2016-09-27 DIAGNOSIS — R1312 Dysphagia, oropharyngeal phase: Secondary | ICD-10-CM | POA: Diagnosis not present

## 2016-09-27 DIAGNOSIS — Z8673 Personal history of transient ischemic attack (TIA), and cerebral infarction without residual deficits: Secondary | ICD-10-CM | POA: Diagnosis not present

## 2016-09-28 DIAGNOSIS — R2681 Unsteadiness on feet: Secondary | ICD-10-CM | POA: Diagnosis not present

## 2016-09-28 DIAGNOSIS — F0390 Unspecified dementia without behavioral disturbance: Secondary | ICD-10-CM | POA: Diagnosis not present

## 2016-09-28 DIAGNOSIS — R41841 Cognitive communication deficit: Secondary | ICD-10-CM | POA: Diagnosis not present

## 2016-09-28 DIAGNOSIS — R471 Dysarthria and anarthria: Secondary | ICD-10-CM | POA: Diagnosis not present

## 2016-09-28 DIAGNOSIS — R1312 Dysphagia, oropharyngeal phase: Secondary | ICD-10-CM | POA: Diagnosis not present

## 2016-09-28 DIAGNOSIS — Z8673 Personal history of transient ischemic attack (TIA), and cerebral infarction without residual deficits: Secondary | ICD-10-CM | POA: Diagnosis not present

## 2016-09-29 DIAGNOSIS — R41841 Cognitive communication deficit: Secondary | ICD-10-CM | POA: Diagnosis not present

## 2016-09-29 DIAGNOSIS — R2681 Unsteadiness on feet: Secondary | ICD-10-CM | POA: Diagnosis not present

## 2016-09-29 DIAGNOSIS — R1312 Dysphagia, oropharyngeal phase: Secondary | ICD-10-CM | POA: Diagnosis not present

## 2016-09-29 DIAGNOSIS — Z8673 Personal history of transient ischemic attack (TIA), and cerebral infarction without residual deficits: Secondary | ICD-10-CM | POA: Diagnosis not present

## 2016-09-29 DIAGNOSIS — F0390 Unspecified dementia without behavioral disturbance: Secondary | ICD-10-CM | POA: Diagnosis not present

## 2016-09-29 DIAGNOSIS — R471 Dysarthria and anarthria: Secondary | ICD-10-CM | POA: Diagnosis not present

## 2016-10-02 ENCOUNTER — Encounter (HOSPITAL_COMMUNITY): Payer: Medicare Other

## 2016-10-02 DIAGNOSIS — Z8673 Personal history of transient ischemic attack (TIA), and cerebral infarction without residual deficits: Secondary | ICD-10-CM | POA: Diagnosis not present

## 2016-10-02 DIAGNOSIS — F0390 Unspecified dementia without behavioral disturbance: Secondary | ICD-10-CM | POA: Diagnosis not present

## 2016-10-02 DIAGNOSIS — R2681 Unsteadiness on feet: Secondary | ICD-10-CM | POA: Diagnosis not present

## 2016-10-02 DIAGNOSIS — R1312 Dysphagia, oropharyngeal phase: Secondary | ICD-10-CM | POA: Diagnosis not present

## 2016-10-02 DIAGNOSIS — R471 Dysarthria and anarthria: Secondary | ICD-10-CM | POA: Diagnosis not present

## 2016-10-02 DIAGNOSIS — R41841 Cognitive communication deficit: Secondary | ICD-10-CM | POA: Diagnosis not present

## 2016-10-03 DIAGNOSIS — R1312 Dysphagia, oropharyngeal phase: Secondary | ICD-10-CM | POA: Diagnosis not present

## 2016-10-03 DIAGNOSIS — R2681 Unsteadiness on feet: Secondary | ICD-10-CM | POA: Diagnosis not present

## 2016-10-03 DIAGNOSIS — R41841 Cognitive communication deficit: Secondary | ICD-10-CM | POA: Diagnosis not present

## 2016-10-03 DIAGNOSIS — F0390 Unspecified dementia without behavioral disturbance: Secondary | ICD-10-CM | POA: Diagnosis not present

## 2016-10-03 DIAGNOSIS — Z8673 Personal history of transient ischemic attack (TIA), and cerebral infarction without residual deficits: Secondary | ICD-10-CM | POA: Diagnosis not present

## 2016-10-03 DIAGNOSIS — R471 Dysarthria and anarthria: Secondary | ICD-10-CM | POA: Diagnosis not present

## 2016-10-04 ENCOUNTER — Encounter (HOSPITAL_COMMUNITY): Payer: Medicare Other

## 2016-10-04 DIAGNOSIS — R2681 Unsteadiness on feet: Secondary | ICD-10-CM | POA: Diagnosis not present

## 2016-10-04 DIAGNOSIS — R471 Dysarthria and anarthria: Secondary | ICD-10-CM | POA: Diagnosis not present

## 2016-10-04 DIAGNOSIS — R1312 Dysphagia, oropharyngeal phase: Secondary | ICD-10-CM | POA: Diagnosis not present

## 2016-10-04 DIAGNOSIS — Z8673 Personal history of transient ischemic attack (TIA), and cerebral infarction without residual deficits: Secondary | ICD-10-CM | POA: Diagnosis not present

## 2016-10-04 DIAGNOSIS — R41841 Cognitive communication deficit: Secondary | ICD-10-CM | POA: Diagnosis not present

## 2016-10-04 DIAGNOSIS — F0390 Unspecified dementia without behavioral disturbance: Secondary | ICD-10-CM | POA: Diagnosis not present

## 2016-10-05 DIAGNOSIS — F0390 Unspecified dementia without behavioral disturbance: Secondary | ICD-10-CM | POA: Diagnosis not present

## 2016-10-05 DIAGNOSIS — R2681 Unsteadiness on feet: Secondary | ICD-10-CM | POA: Diagnosis not present

## 2016-10-05 DIAGNOSIS — Z8673 Personal history of transient ischemic attack (TIA), and cerebral infarction without residual deficits: Secondary | ICD-10-CM | POA: Diagnosis not present

## 2016-10-05 DIAGNOSIS — R471 Dysarthria and anarthria: Secondary | ICD-10-CM | POA: Diagnosis not present

## 2016-10-05 DIAGNOSIS — R41841 Cognitive communication deficit: Secondary | ICD-10-CM | POA: Diagnosis not present

## 2016-10-05 DIAGNOSIS — R1312 Dysphagia, oropharyngeal phase: Secondary | ICD-10-CM | POA: Diagnosis not present

## 2016-10-06 DIAGNOSIS — R41841 Cognitive communication deficit: Secondary | ICD-10-CM | POA: Diagnosis not present

## 2016-10-06 DIAGNOSIS — R1312 Dysphagia, oropharyngeal phase: Secondary | ICD-10-CM | POA: Diagnosis not present

## 2016-10-06 DIAGNOSIS — R471 Dysarthria and anarthria: Secondary | ICD-10-CM | POA: Diagnosis not present

## 2016-10-06 DIAGNOSIS — Z8673 Personal history of transient ischemic attack (TIA), and cerebral infarction without residual deficits: Secondary | ICD-10-CM | POA: Diagnosis not present

## 2016-10-06 DIAGNOSIS — F0390 Unspecified dementia without behavioral disturbance: Secondary | ICD-10-CM | POA: Diagnosis not present

## 2016-10-06 DIAGNOSIS — R2681 Unsteadiness on feet: Secondary | ICD-10-CM | POA: Diagnosis not present

## 2016-10-07 DIAGNOSIS — R2681 Unsteadiness on feet: Secondary | ICD-10-CM | POA: Diagnosis not present

## 2016-10-07 DIAGNOSIS — R471 Dysarthria and anarthria: Secondary | ICD-10-CM | POA: Diagnosis not present

## 2016-10-07 DIAGNOSIS — F0390 Unspecified dementia without behavioral disturbance: Secondary | ICD-10-CM | POA: Diagnosis not present

## 2016-10-07 DIAGNOSIS — R41841 Cognitive communication deficit: Secondary | ICD-10-CM | POA: Diagnosis not present

## 2016-10-07 DIAGNOSIS — Z8673 Personal history of transient ischemic attack (TIA), and cerebral infarction without residual deficits: Secondary | ICD-10-CM | POA: Diagnosis not present

## 2016-10-07 DIAGNOSIS — R1312 Dysphagia, oropharyngeal phase: Secondary | ICD-10-CM | POA: Diagnosis not present

## 2016-10-08 DIAGNOSIS — R1312 Dysphagia, oropharyngeal phase: Secondary | ICD-10-CM | POA: Diagnosis not present

## 2016-10-08 DIAGNOSIS — Z8673 Personal history of transient ischemic attack (TIA), and cerebral infarction without residual deficits: Secondary | ICD-10-CM | POA: Diagnosis not present

## 2016-10-08 DIAGNOSIS — R471 Dysarthria and anarthria: Secondary | ICD-10-CM | POA: Diagnosis not present

## 2016-10-08 DIAGNOSIS — R2681 Unsteadiness on feet: Secondary | ICD-10-CM | POA: Diagnosis not present

## 2016-10-08 DIAGNOSIS — F0390 Unspecified dementia without behavioral disturbance: Secondary | ICD-10-CM | POA: Diagnosis not present

## 2016-10-08 DIAGNOSIS — R41841 Cognitive communication deficit: Secondary | ICD-10-CM | POA: Diagnosis not present

## 2016-10-10 DIAGNOSIS — R471 Dysarthria and anarthria: Secondary | ICD-10-CM | POA: Diagnosis not present

## 2016-10-10 DIAGNOSIS — F0390 Unspecified dementia without behavioral disturbance: Secondary | ICD-10-CM | POA: Diagnosis not present

## 2016-10-10 DIAGNOSIS — R2681 Unsteadiness on feet: Secondary | ICD-10-CM | POA: Diagnosis not present

## 2016-10-10 DIAGNOSIS — Z8673 Personal history of transient ischemic attack (TIA), and cerebral infarction without residual deficits: Secondary | ICD-10-CM | POA: Diagnosis not present

## 2016-10-10 DIAGNOSIS — R41841 Cognitive communication deficit: Secondary | ICD-10-CM | POA: Diagnosis not present

## 2016-10-10 DIAGNOSIS — R1312 Dysphagia, oropharyngeal phase: Secondary | ICD-10-CM | POA: Diagnosis not present

## 2016-10-11 ENCOUNTER — Encounter (HOSPITAL_COMMUNITY): Payer: Medicare Other

## 2016-10-11 DIAGNOSIS — R0989 Other specified symptoms and signs involving the circulatory and respiratory systems: Secondary | ICD-10-CM | POA: Diagnosis not present

## 2016-10-11 DIAGNOSIS — R41841 Cognitive communication deficit: Secondary | ICD-10-CM | POA: Diagnosis not present

## 2016-10-11 DIAGNOSIS — R471 Dysarthria and anarthria: Secondary | ICD-10-CM | POA: Diagnosis not present

## 2016-10-11 DIAGNOSIS — Z8673 Personal history of transient ischemic attack (TIA), and cerebral infarction without residual deficits: Secondary | ICD-10-CM | POA: Diagnosis not present

## 2016-10-11 DIAGNOSIS — R2681 Unsteadiness on feet: Secondary | ICD-10-CM | POA: Diagnosis not present

## 2016-10-11 DIAGNOSIS — R05 Cough: Secondary | ICD-10-CM | POA: Diagnosis not present

## 2016-10-11 DIAGNOSIS — R1312 Dysphagia, oropharyngeal phase: Secondary | ICD-10-CM | POA: Diagnosis not present

## 2016-10-11 DIAGNOSIS — I10 Essential (primary) hypertension: Secondary | ICD-10-CM | POA: Diagnosis not present

## 2016-10-11 DIAGNOSIS — J029 Acute pharyngitis, unspecified: Secondary | ICD-10-CM | POA: Diagnosis not present

## 2016-10-11 DIAGNOSIS — F0390 Unspecified dementia without behavioral disturbance: Secondary | ICD-10-CM | POA: Diagnosis not present

## 2016-10-12 DIAGNOSIS — F0390 Unspecified dementia without behavioral disturbance: Secondary | ICD-10-CM | POA: Diagnosis not present

## 2016-10-12 DIAGNOSIS — R471 Dysarthria and anarthria: Secondary | ICD-10-CM | POA: Diagnosis not present

## 2016-10-12 DIAGNOSIS — Z8673 Personal history of transient ischemic attack (TIA), and cerebral infarction without residual deficits: Secondary | ICD-10-CM | POA: Diagnosis not present

## 2016-10-12 DIAGNOSIS — R41841 Cognitive communication deficit: Secondary | ICD-10-CM | POA: Diagnosis not present

## 2016-10-12 DIAGNOSIS — R2681 Unsteadiness on feet: Secondary | ICD-10-CM | POA: Diagnosis not present

## 2016-10-12 DIAGNOSIS — R1312 Dysphagia, oropharyngeal phase: Secondary | ICD-10-CM | POA: Diagnosis not present

## 2016-10-13 DIAGNOSIS — R1312 Dysphagia, oropharyngeal phase: Secondary | ICD-10-CM | POA: Diagnosis not present

## 2016-10-13 DIAGNOSIS — R41841 Cognitive communication deficit: Secondary | ICD-10-CM | POA: Diagnosis not present

## 2016-10-13 DIAGNOSIS — Z8673 Personal history of transient ischemic attack (TIA), and cerebral infarction without residual deficits: Secondary | ICD-10-CM | POA: Diagnosis not present

## 2016-10-13 DIAGNOSIS — F0390 Unspecified dementia without behavioral disturbance: Secondary | ICD-10-CM | POA: Diagnosis not present

## 2016-10-13 DIAGNOSIS — R471 Dysarthria and anarthria: Secondary | ICD-10-CM | POA: Diagnosis not present

## 2016-10-13 DIAGNOSIS — R2681 Unsteadiness on feet: Secondary | ICD-10-CM | POA: Diagnosis not present

## 2016-10-14 DIAGNOSIS — F0390 Unspecified dementia without behavioral disturbance: Secondary | ICD-10-CM | POA: Diagnosis not present

## 2016-10-14 DIAGNOSIS — R1312 Dysphagia, oropharyngeal phase: Secondary | ICD-10-CM | POA: Diagnosis not present

## 2016-10-14 DIAGNOSIS — R41841 Cognitive communication deficit: Secondary | ICD-10-CM | POA: Diagnosis not present

## 2016-10-14 DIAGNOSIS — Z8673 Personal history of transient ischemic attack (TIA), and cerebral infarction without residual deficits: Secondary | ICD-10-CM | POA: Diagnosis not present

## 2016-10-14 DIAGNOSIS — R2681 Unsteadiness on feet: Secondary | ICD-10-CM | POA: Diagnosis not present

## 2016-10-14 DIAGNOSIS — R471 Dysarthria and anarthria: Secondary | ICD-10-CM | POA: Diagnosis not present

## 2016-10-15 DIAGNOSIS — F0390 Unspecified dementia without behavioral disturbance: Secondary | ICD-10-CM | POA: Diagnosis not present

## 2016-10-15 DIAGNOSIS — R471 Dysarthria and anarthria: Secondary | ICD-10-CM | POA: Diagnosis not present

## 2016-10-15 DIAGNOSIS — R41841 Cognitive communication deficit: Secondary | ICD-10-CM | POA: Diagnosis not present

## 2016-10-15 DIAGNOSIS — R1312 Dysphagia, oropharyngeal phase: Secondary | ICD-10-CM | POA: Diagnosis not present

## 2016-10-15 DIAGNOSIS — R2681 Unsteadiness on feet: Secondary | ICD-10-CM | POA: Diagnosis not present

## 2016-10-15 DIAGNOSIS — Z8673 Personal history of transient ischemic attack (TIA), and cerebral infarction without residual deficits: Secondary | ICD-10-CM | POA: Diagnosis not present

## 2016-10-17 DIAGNOSIS — R471 Dysarthria and anarthria: Secondary | ICD-10-CM | POA: Diagnosis not present

## 2016-10-17 DIAGNOSIS — R2681 Unsteadiness on feet: Secondary | ICD-10-CM | POA: Diagnosis not present

## 2016-10-17 DIAGNOSIS — Z8673 Personal history of transient ischemic attack (TIA), and cerebral infarction without residual deficits: Secondary | ICD-10-CM | POA: Diagnosis not present

## 2016-10-17 DIAGNOSIS — R1312 Dysphagia, oropharyngeal phase: Secondary | ICD-10-CM | POA: Diagnosis not present

## 2016-10-17 DIAGNOSIS — M6281 Muscle weakness (generalized): Secondary | ICD-10-CM | POA: Diagnosis not present

## 2016-10-17 DIAGNOSIS — F0390 Unspecified dementia without behavioral disturbance: Secondary | ICD-10-CM | POA: Diagnosis not present

## 2016-10-17 DIAGNOSIS — R4701 Aphasia: Secondary | ICD-10-CM | POA: Diagnosis not present

## 2016-10-17 DIAGNOSIS — R41841 Cognitive communication deficit: Secondary | ICD-10-CM | POA: Diagnosis not present

## 2016-10-18 DIAGNOSIS — M6281 Muscle weakness (generalized): Secondary | ICD-10-CM | POA: Diagnosis not present

## 2016-10-18 DIAGNOSIS — R471 Dysarthria and anarthria: Secondary | ICD-10-CM | POA: Diagnosis not present

## 2016-10-18 DIAGNOSIS — R1312 Dysphagia, oropharyngeal phase: Secondary | ICD-10-CM | POA: Diagnosis not present

## 2016-10-18 DIAGNOSIS — Z8673 Personal history of transient ischemic attack (TIA), and cerebral infarction without residual deficits: Secondary | ICD-10-CM | POA: Diagnosis not present

## 2016-10-18 DIAGNOSIS — R2681 Unsteadiness on feet: Secondary | ICD-10-CM | POA: Diagnosis not present

## 2016-10-18 DIAGNOSIS — R41841 Cognitive communication deficit: Secondary | ICD-10-CM | POA: Diagnosis not present

## 2016-10-19 DIAGNOSIS — R41841 Cognitive communication deficit: Secondary | ICD-10-CM | POA: Diagnosis not present

## 2016-10-19 DIAGNOSIS — R471 Dysarthria and anarthria: Secondary | ICD-10-CM | POA: Diagnosis not present

## 2016-10-19 DIAGNOSIS — Z8673 Personal history of transient ischemic attack (TIA), and cerebral infarction without residual deficits: Secondary | ICD-10-CM | POA: Diagnosis not present

## 2016-10-19 DIAGNOSIS — R2681 Unsteadiness on feet: Secondary | ICD-10-CM | POA: Diagnosis not present

## 2016-10-19 DIAGNOSIS — R1312 Dysphagia, oropharyngeal phase: Secondary | ICD-10-CM | POA: Diagnosis not present

## 2016-10-19 DIAGNOSIS — M6281 Muscle weakness (generalized): Secondary | ICD-10-CM | POA: Diagnosis not present

## 2016-10-20 ENCOUNTER — Encounter: Payer: Medicare Other | Admitting: Family Medicine

## 2016-10-20 DIAGNOSIS — R1312 Dysphagia, oropharyngeal phase: Secondary | ICD-10-CM | POA: Diagnosis not present

## 2016-10-20 DIAGNOSIS — R41841 Cognitive communication deficit: Secondary | ICD-10-CM | POA: Diagnosis not present

## 2016-10-20 DIAGNOSIS — R32 Unspecified urinary incontinence: Secondary | ICD-10-CM | POA: Diagnosis not present

## 2016-10-20 DIAGNOSIS — R471 Dysarthria and anarthria: Secondary | ICD-10-CM | POA: Diagnosis not present

## 2016-10-20 DIAGNOSIS — R2681 Unsteadiness on feet: Secondary | ICD-10-CM | POA: Diagnosis not present

## 2016-10-20 DIAGNOSIS — Z8673 Personal history of transient ischemic attack (TIA), and cerebral infarction without residual deficits: Secondary | ICD-10-CM | POA: Diagnosis not present

## 2016-10-20 DIAGNOSIS — J029 Acute pharyngitis, unspecified: Secondary | ICD-10-CM | POA: Diagnosis not present

## 2016-10-20 DIAGNOSIS — E119 Type 2 diabetes mellitus without complications: Secondary | ICD-10-CM | POA: Diagnosis not present

## 2016-10-20 DIAGNOSIS — M6281 Muscle weakness (generalized): Secondary | ICD-10-CM | POA: Diagnosis not present

## 2016-10-20 DIAGNOSIS — F0150 Vascular dementia without behavioral disturbance: Secondary | ICD-10-CM | POA: Diagnosis not present

## 2016-10-22 DIAGNOSIS — Z8673 Personal history of transient ischemic attack (TIA), and cerebral infarction without residual deficits: Secondary | ICD-10-CM | POA: Diagnosis not present

## 2016-10-22 DIAGNOSIS — R41841 Cognitive communication deficit: Secondary | ICD-10-CM | POA: Diagnosis not present

## 2016-10-22 DIAGNOSIS — R471 Dysarthria and anarthria: Secondary | ICD-10-CM | POA: Diagnosis not present

## 2016-10-22 DIAGNOSIS — M6281 Muscle weakness (generalized): Secondary | ICD-10-CM | POA: Diagnosis not present

## 2016-10-22 DIAGNOSIS — R2681 Unsteadiness on feet: Secondary | ICD-10-CM | POA: Diagnosis not present

## 2016-10-22 DIAGNOSIS — R1312 Dysphagia, oropharyngeal phase: Secondary | ICD-10-CM | POA: Diagnosis not present

## 2016-10-23 DIAGNOSIS — R2681 Unsteadiness on feet: Secondary | ICD-10-CM | POA: Diagnosis not present

## 2016-10-23 DIAGNOSIS — R41841 Cognitive communication deficit: Secondary | ICD-10-CM | POA: Diagnosis not present

## 2016-10-23 DIAGNOSIS — R1312 Dysphagia, oropharyngeal phase: Secondary | ICD-10-CM | POA: Diagnosis not present

## 2016-10-23 DIAGNOSIS — R471 Dysarthria and anarthria: Secondary | ICD-10-CM | POA: Diagnosis not present

## 2016-10-23 DIAGNOSIS — Z8673 Personal history of transient ischemic attack (TIA), and cerebral infarction without residual deficits: Secondary | ICD-10-CM | POA: Diagnosis not present

## 2016-10-23 DIAGNOSIS — M6281 Muscle weakness (generalized): Secondary | ICD-10-CM | POA: Diagnosis not present

## 2016-10-24 DIAGNOSIS — R41841 Cognitive communication deficit: Secondary | ICD-10-CM | POA: Diagnosis not present

## 2016-10-24 DIAGNOSIS — R1312 Dysphagia, oropharyngeal phase: Secondary | ICD-10-CM | POA: Diagnosis not present

## 2016-10-24 DIAGNOSIS — Z8673 Personal history of transient ischemic attack (TIA), and cerebral infarction without residual deficits: Secondary | ICD-10-CM | POA: Diagnosis not present

## 2016-10-24 DIAGNOSIS — R2681 Unsteadiness on feet: Secondary | ICD-10-CM | POA: Diagnosis not present

## 2016-10-24 DIAGNOSIS — M6281 Muscle weakness (generalized): Secondary | ICD-10-CM | POA: Diagnosis not present

## 2016-10-24 DIAGNOSIS — R471 Dysarthria and anarthria: Secondary | ICD-10-CM | POA: Diagnosis not present

## 2016-10-25 DIAGNOSIS — R471 Dysarthria and anarthria: Secondary | ICD-10-CM | POA: Diagnosis not present

## 2016-10-25 DIAGNOSIS — Z8673 Personal history of transient ischemic attack (TIA), and cerebral infarction without residual deficits: Secondary | ICD-10-CM | POA: Diagnosis not present

## 2016-10-25 DIAGNOSIS — R41841 Cognitive communication deficit: Secondary | ICD-10-CM | POA: Diagnosis not present

## 2016-10-25 DIAGNOSIS — R1312 Dysphagia, oropharyngeal phase: Secondary | ICD-10-CM | POA: Diagnosis not present

## 2016-10-25 DIAGNOSIS — M6281 Muscle weakness (generalized): Secondary | ICD-10-CM | POA: Diagnosis not present

## 2016-10-25 DIAGNOSIS — R2681 Unsteadiness on feet: Secondary | ICD-10-CM | POA: Diagnosis not present

## 2016-10-26 DIAGNOSIS — Z8673 Personal history of transient ischemic attack (TIA), and cerebral infarction without residual deficits: Secondary | ICD-10-CM | POA: Diagnosis not present

## 2016-10-26 DIAGNOSIS — R2681 Unsteadiness on feet: Secondary | ICD-10-CM | POA: Diagnosis not present

## 2016-10-26 DIAGNOSIS — R1312 Dysphagia, oropharyngeal phase: Secondary | ICD-10-CM | POA: Diagnosis not present

## 2016-10-26 DIAGNOSIS — R471 Dysarthria and anarthria: Secondary | ICD-10-CM | POA: Diagnosis not present

## 2016-10-26 DIAGNOSIS — R41841 Cognitive communication deficit: Secondary | ICD-10-CM | POA: Diagnosis not present

## 2016-10-26 DIAGNOSIS — M6281 Muscle weakness (generalized): Secondary | ICD-10-CM | POA: Diagnosis not present

## 2016-10-27 DIAGNOSIS — R471 Dysarthria and anarthria: Secondary | ICD-10-CM | POA: Diagnosis not present

## 2016-10-27 DIAGNOSIS — R1312 Dysphagia, oropharyngeal phase: Secondary | ICD-10-CM | POA: Diagnosis not present

## 2016-10-27 DIAGNOSIS — M6281 Muscle weakness (generalized): Secondary | ICD-10-CM | POA: Diagnosis not present

## 2016-10-27 DIAGNOSIS — R2681 Unsteadiness on feet: Secondary | ICD-10-CM | POA: Diagnosis not present

## 2016-10-27 DIAGNOSIS — R41841 Cognitive communication deficit: Secondary | ICD-10-CM | POA: Diagnosis not present

## 2016-10-27 DIAGNOSIS — Z8673 Personal history of transient ischemic attack (TIA), and cerebral infarction without residual deficits: Secondary | ICD-10-CM | POA: Diagnosis not present

## 2016-10-29 DIAGNOSIS — R41841 Cognitive communication deficit: Secondary | ICD-10-CM | POA: Diagnosis not present

## 2016-10-29 DIAGNOSIS — R1312 Dysphagia, oropharyngeal phase: Secondary | ICD-10-CM | POA: Diagnosis not present

## 2016-10-29 DIAGNOSIS — Z8673 Personal history of transient ischemic attack (TIA), and cerebral infarction without residual deficits: Secondary | ICD-10-CM | POA: Diagnosis not present

## 2016-10-29 DIAGNOSIS — M6281 Muscle weakness (generalized): Secondary | ICD-10-CM | POA: Diagnosis not present

## 2016-10-29 DIAGNOSIS — R471 Dysarthria and anarthria: Secondary | ICD-10-CM | POA: Diagnosis not present

## 2016-10-29 DIAGNOSIS — R2681 Unsteadiness on feet: Secondary | ICD-10-CM | POA: Diagnosis not present

## 2016-10-31 DIAGNOSIS — Z8673 Personal history of transient ischemic attack (TIA), and cerebral infarction without residual deficits: Secondary | ICD-10-CM | POA: Diagnosis not present

## 2016-10-31 DIAGNOSIS — R2681 Unsteadiness on feet: Secondary | ICD-10-CM | POA: Diagnosis not present

## 2016-10-31 DIAGNOSIS — R471 Dysarthria and anarthria: Secondary | ICD-10-CM | POA: Diagnosis not present

## 2016-10-31 DIAGNOSIS — R1312 Dysphagia, oropharyngeal phase: Secondary | ICD-10-CM | POA: Diagnosis not present

## 2016-10-31 DIAGNOSIS — M6281 Muscle weakness (generalized): Secondary | ICD-10-CM | POA: Diagnosis not present

## 2016-10-31 DIAGNOSIS — R41841 Cognitive communication deficit: Secondary | ICD-10-CM | POA: Diagnosis not present

## 2016-11-01 DIAGNOSIS — R41841 Cognitive communication deficit: Secondary | ICD-10-CM | POA: Diagnosis not present

## 2016-11-01 DIAGNOSIS — R2681 Unsteadiness on feet: Secondary | ICD-10-CM | POA: Diagnosis not present

## 2016-11-01 DIAGNOSIS — Z8673 Personal history of transient ischemic attack (TIA), and cerebral infarction without residual deficits: Secondary | ICD-10-CM | POA: Diagnosis not present

## 2016-11-01 DIAGNOSIS — M6281 Muscle weakness (generalized): Secondary | ICD-10-CM | POA: Diagnosis not present

## 2016-11-01 DIAGNOSIS — R471 Dysarthria and anarthria: Secondary | ICD-10-CM | POA: Diagnosis not present

## 2016-11-01 DIAGNOSIS — R1312 Dysphagia, oropharyngeal phase: Secondary | ICD-10-CM | POA: Diagnosis not present

## 2016-11-02 DIAGNOSIS — R2681 Unsteadiness on feet: Secondary | ICD-10-CM | POA: Diagnosis not present

## 2016-11-02 DIAGNOSIS — R471 Dysarthria and anarthria: Secondary | ICD-10-CM | POA: Diagnosis not present

## 2016-11-02 DIAGNOSIS — M6281 Muscle weakness (generalized): Secondary | ICD-10-CM | POA: Diagnosis not present

## 2016-11-02 DIAGNOSIS — Z8673 Personal history of transient ischemic attack (TIA), and cerebral infarction without residual deficits: Secondary | ICD-10-CM | POA: Diagnosis not present

## 2016-11-02 DIAGNOSIS — R41841 Cognitive communication deficit: Secondary | ICD-10-CM | POA: Diagnosis not present

## 2016-11-02 DIAGNOSIS — R1312 Dysphagia, oropharyngeal phase: Secondary | ICD-10-CM | POA: Diagnosis not present

## 2016-11-03 DIAGNOSIS — R1312 Dysphagia, oropharyngeal phase: Secondary | ICD-10-CM | POA: Diagnosis not present

## 2016-11-03 DIAGNOSIS — R471 Dysarthria and anarthria: Secondary | ICD-10-CM | POA: Diagnosis not present

## 2016-11-03 DIAGNOSIS — Z8673 Personal history of transient ischemic attack (TIA), and cerebral infarction without residual deficits: Secondary | ICD-10-CM | POA: Diagnosis not present

## 2016-11-03 DIAGNOSIS — M6281 Muscle weakness (generalized): Secondary | ICD-10-CM | POA: Diagnosis not present

## 2016-11-03 DIAGNOSIS — R41841 Cognitive communication deficit: Secondary | ICD-10-CM | POA: Diagnosis not present

## 2016-11-03 DIAGNOSIS — R2681 Unsteadiness on feet: Secondary | ICD-10-CM | POA: Diagnosis not present

## 2016-11-04 DIAGNOSIS — R2681 Unsteadiness on feet: Secondary | ICD-10-CM | POA: Diagnosis not present

## 2016-11-04 DIAGNOSIS — Z8673 Personal history of transient ischemic attack (TIA), and cerebral infarction without residual deficits: Secondary | ICD-10-CM | POA: Diagnosis not present

## 2016-11-04 DIAGNOSIS — R471 Dysarthria and anarthria: Secondary | ICD-10-CM | POA: Diagnosis not present

## 2016-11-04 DIAGNOSIS — R1312 Dysphagia, oropharyngeal phase: Secondary | ICD-10-CM | POA: Diagnosis not present

## 2016-11-04 DIAGNOSIS — R41841 Cognitive communication deficit: Secondary | ICD-10-CM | POA: Diagnosis not present

## 2016-11-04 DIAGNOSIS — M6281 Muscle weakness (generalized): Secondary | ICD-10-CM | POA: Diagnosis not present

## 2016-11-05 DIAGNOSIS — M6281 Muscle weakness (generalized): Secondary | ICD-10-CM | POA: Diagnosis not present

## 2016-11-05 DIAGNOSIS — R1312 Dysphagia, oropharyngeal phase: Secondary | ICD-10-CM | POA: Diagnosis not present

## 2016-11-05 DIAGNOSIS — R2681 Unsteadiness on feet: Secondary | ICD-10-CM | POA: Diagnosis not present

## 2016-11-05 DIAGNOSIS — Z8673 Personal history of transient ischemic attack (TIA), and cerebral infarction without residual deficits: Secondary | ICD-10-CM | POA: Diagnosis not present

## 2016-11-05 DIAGNOSIS — R471 Dysarthria and anarthria: Secondary | ICD-10-CM | POA: Diagnosis not present

## 2016-11-05 DIAGNOSIS — R41841 Cognitive communication deficit: Secondary | ICD-10-CM | POA: Diagnosis not present

## 2016-11-06 DIAGNOSIS — R2681 Unsteadiness on feet: Secondary | ICD-10-CM | POA: Diagnosis not present

## 2016-11-06 DIAGNOSIS — Z8673 Personal history of transient ischemic attack (TIA), and cerebral infarction without residual deficits: Secondary | ICD-10-CM | POA: Diagnosis not present

## 2016-11-06 DIAGNOSIS — M6281 Muscle weakness (generalized): Secondary | ICD-10-CM | POA: Diagnosis not present

## 2016-11-06 DIAGNOSIS — R1312 Dysphagia, oropharyngeal phase: Secondary | ICD-10-CM | POA: Diagnosis not present

## 2016-11-06 DIAGNOSIS — R41841 Cognitive communication deficit: Secondary | ICD-10-CM | POA: Diagnosis not present

## 2016-11-06 DIAGNOSIS — R471 Dysarthria and anarthria: Secondary | ICD-10-CM | POA: Diagnosis not present

## 2016-11-07 DIAGNOSIS — R471 Dysarthria and anarthria: Secondary | ICD-10-CM | POA: Diagnosis not present

## 2016-11-07 DIAGNOSIS — R1312 Dysphagia, oropharyngeal phase: Secondary | ICD-10-CM | POA: Diagnosis not present

## 2016-11-07 DIAGNOSIS — R41841 Cognitive communication deficit: Secondary | ICD-10-CM | POA: Diagnosis not present

## 2016-11-07 DIAGNOSIS — Z8673 Personal history of transient ischemic attack (TIA), and cerebral infarction without residual deficits: Secondary | ICD-10-CM | POA: Diagnosis not present

## 2016-11-07 DIAGNOSIS — M6281 Muscle weakness (generalized): Secondary | ICD-10-CM | POA: Diagnosis not present

## 2016-11-07 DIAGNOSIS — R2681 Unsteadiness on feet: Secondary | ICD-10-CM | POA: Diagnosis not present

## 2016-11-08 DIAGNOSIS — R41841 Cognitive communication deficit: Secondary | ICD-10-CM | POA: Diagnosis not present

## 2016-11-08 DIAGNOSIS — R1312 Dysphagia, oropharyngeal phase: Secondary | ICD-10-CM | POA: Diagnosis not present

## 2016-11-08 DIAGNOSIS — M6281 Muscle weakness (generalized): Secondary | ICD-10-CM | POA: Diagnosis not present

## 2016-11-08 DIAGNOSIS — Z8673 Personal history of transient ischemic attack (TIA), and cerebral infarction without residual deficits: Secondary | ICD-10-CM | POA: Diagnosis not present

## 2016-11-08 DIAGNOSIS — R471 Dysarthria and anarthria: Secondary | ICD-10-CM | POA: Diagnosis not present

## 2016-11-08 DIAGNOSIS — R2681 Unsteadiness on feet: Secondary | ICD-10-CM | POA: Diagnosis not present

## 2016-11-09 ENCOUNTER — Ambulatory Visit (INDEPENDENT_AMBULATORY_CARE_PROVIDER_SITE_OTHER): Payer: Medicare Other | Admitting: Otolaryngology

## 2016-11-09 DIAGNOSIS — R1312 Dysphagia, oropharyngeal phase: Secondary | ICD-10-CM | POA: Diagnosis not present

## 2016-11-09 DIAGNOSIS — R2681 Unsteadiness on feet: Secondary | ICD-10-CM | POA: Diagnosis not present

## 2016-11-09 DIAGNOSIS — M6281 Muscle weakness (generalized): Secondary | ICD-10-CM | POA: Diagnosis not present

## 2016-11-09 DIAGNOSIS — Z8673 Personal history of transient ischemic attack (TIA), and cerebral infarction without residual deficits: Secondary | ICD-10-CM | POA: Diagnosis not present

## 2016-11-09 DIAGNOSIS — R41841 Cognitive communication deficit: Secondary | ICD-10-CM | POA: Diagnosis not present

## 2016-11-09 DIAGNOSIS — R471 Dysarthria and anarthria: Secondary | ICD-10-CM | POA: Diagnosis not present

## 2016-11-10 DIAGNOSIS — R41841 Cognitive communication deficit: Secondary | ICD-10-CM | POA: Diagnosis not present

## 2016-11-10 DIAGNOSIS — Z8673 Personal history of transient ischemic attack (TIA), and cerebral infarction without residual deficits: Secondary | ICD-10-CM | POA: Diagnosis not present

## 2016-11-10 DIAGNOSIS — R471 Dysarthria and anarthria: Secondary | ICD-10-CM | POA: Diagnosis not present

## 2016-11-10 DIAGNOSIS — R1312 Dysphagia, oropharyngeal phase: Secondary | ICD-10-CM | POA: Diagnosis not present

## 2016-11-10 DIAGNOSIS — M6281 Muscle weakness (generalized): Secondary | ICD-10-CM | POA: Diagnosis not present

## 2016-11-10 DIAGNOSIS — R2681 Unsteadiness on feet: Secondary | ICD-10-CM | POA: Diagnosis not present

## 2016-11-12 DIAGNOSIS — Z8673 Personal history of transient ischemic attack (TIA), and cerebral infarction without residual deficits: Secondary | ICD-10-CM | POA: Diagnosis not present

## 2016-11-12 DIAGNOSIS — R1312 Dysphagia, oropharyngeal phase: Secondary | ICD-10-CM | POA: Diagnosis not present

## 2016-11-12 DIAGNOSIS — M6281 Muscle weakness (generalized): Secondary | ICD-10-CM | POA: Diagnosis not present

## 2016-11-12 DIAGNOSIS — R2681 Unsteadiness on feet: Secondary | ICD-10-CM | POA: Diagnosis not present

## 2016-11-12 DIAGNOSIS — R41841 Cognitive communication deficit: Secondary | ICD-10-CM | POA: Diagnosis not present

## 2016-11-12 DIAGNOSIS — R471 Dysarthria and anarthria: Secondary | ICD-10-CM | POA: Diagnosis not present

## 2016-11-13 DIAGNOSIS — R1312 Dysphagia, oropharyngeal phase: Secondary | ICD-10-CM | POA: Diagnosis not present

## 2016-11-13 DIAGNOSIS — Z8673 Personal history of transient ischemic attack (TIA), and cerebral infarction without residual deficits: Secondary | ICD-10-CM | POA: Diagnosis not present

## 2016-11-13 DIAGNOSIS — R41841 Cognitive communication deficit: Secondary | ICD-10-CM | POA: Diagnosis not present

## 2016-11-13 DIAGNOSIS — R471 Dysarthria and anarthria: Secondary | ICD-10-CM | POA: Diagnosis not present

## 2016-11-13 DIAGNOSIS — R2681 Unsteadiness on feet: Secondary | ICD-10-CM | POA: Diagnosis not present

## 2016-11-13 DIAGNOSIS — M6281 Muscle weakness (generalized): Secondary | ICD-10-CM | POA: Diagnosis not present

## 2016-11-14 DIAGNOSIS — R41841 Cognitive communication deficit: Secondary | ICD-10-CM | POA: Diagnosis not present

## 2016-11-14 DIAGNOSIS — Z8673 Personal history of transient ischemic attack (TIA), and cerebral infarction without residual deficits: Secondary | ICD-10-CM | POA: Diagnosis not present

## 2016-11-14 DIAGNOSIS — R2681 Unsteadiness on feet: Secondary | ICD-10-CM | POA: Diagnosis not present

## 2016-11-14 DIAGNOSIS — R1312 Dysphagia, oropharyngeal phase: Secondary | ICD-10-CM | POA: Diagnosis not present

## 2016-11-14 DIAGNOSIS — M6281 Muscle weakness (generalized): Secondary | ICD-10-CM | POA: Diagnosis not present

## 2016-11-14 DIAGNOSIS — R471 Dysarthria and anarthria: Secondary | ICD-10-CM | POA: Diagnosis not present

## 2016-11-15 DIAGNOSIS — R2681 Unsteadiness on feet: Secondary | ICD-10-CM | POA: Diagnosis not present

## 2016-11-15 DIAGNOSIS — M6281 Muscle weakness (generalized): Secondary | ICD-10-CM | POA: Diagnosis not present

## 2016-11-15 DIAGNOSIS — R471 Dysarthria and anarthria: Secondary | ICD-10-CM | POA: Diagnosis not present

## 2016-11-15 DIAGNOSIS — Z8673 Personal history of transient ischemic attack (TIA), and cerebral infarction without residual deficits: Secondary | ICD-10-CM | POA: Diagnosis not present

## 2016-11-15 DIAGNOSIS — R41841 Cognitive communication deficit: Secondary | ICD-10-CM | POA: Diagnosis not present

## 2016-11-15 DIAGNOSIS — R1312 Dysphagia, oropharyngeal phase: Secondary | ICD-10-CM | POA: Diagnosis not present

## 2016-11-16 DIAGNOSIS — E119 Type 2 diabetes mellitus without complications: Secondary | ICD-10-CM | POA: Diagnosis not present

## 2016-11-16 DIAGNOSIS — F0390 Unspecified dementia without behavioral disturbance: Secondary | ICD-10-CM | POA: Diagnosis not present

## 2016-11-16 DIAGNOSIS — R41841 Cognitive communication deficit: Secondary | ICD-10-CM | POA: Diagnosis not present

## 2016-11-16 DIAGNOSIS — R471 Dysarthria and anarthria: Secondary | ICD-10-CM | POA: Diagnosis not present

## 2016-11-16 DIAGNOSIS — R32 Unspecified urinary incontinence: Secondary | ICD-10-CM | POA: Diagnosis not present

## 2016-11-16 DIAGNOSIS — R2681 Unsteadiness on feet: Secondary | ICD-10-CM | POA: Diagnosis not present

## 2016-11-16 DIAGNOSIS — Z8673 Personal history of transient ischemic attack (TIA), and cerebral infarction without residual deficits: Secondary | ICD-10-CM | POA: Diagnosis not present

## 2016-11-16 DIAGNOSIS — F0150 Vascular dementia without behavioral disturbance: Secondary | ICD-10-CM | POA: Diagnosis not present

## 2016-11-16 DIAGNOSIS — I10 Essential (primary) hypertension: Secondary | ICD-10-CM | POA: Diagnosis not present

## 2016-11-16 DIAGNOSIS — R4701 Aphasia: Secondary | ICD-10-CM | POA: Diagnosis not present

## 2016-11-16 DIAGNOSIS — M6281 Muscle weakness (generalized): Secondary | ICD-10-CM | POA: Diagnosis not present

## 2016-11-16 DIAGNOSIS — R1312 Dysphagia, oropharyngeal phase: Secondary | ICD-10-CM | POA: Diagnosis not present

## 2016-11-17 DIAGNOSIS — M6281 Muscle weakness (generalized): Secondary | ICD-10-CM | POA: Diagnosis not present

## 2016-11-17 DIAGNOSIS — R1312 Dysphagia, oropharyngeal phase: Secondary | ICD-10-CM | POA: Diagnosis not present

## 2016-11-17 DIAGNOSIS — R2681 Unsteadiness on feet: Secondary | ICD-10-CM | POA: Diagnosis not present

## 2016-11-17 DIAGNOSIS — R471 Dysarthria and anarthria: Secondary | ICD-10-CM | POA: Diagnosis not present

## 2016-11-17 DIAGNOSIS — R41841 Cognitive communication deficit: Secondary | ICD-10-CM | POA: Diagnosis not present

## 2016-11-17 DIAGNOSIS — Z8673 Personal history of transient ischemic attack (TIA), and cerebral infarction without residual deficits: Secondary | ICD-10-CM | POA: Diagnosis not present

## 2016-11-19 DIAGNOSIS — R2681 Unsteadiness on feet: Secondary | ICD-10-CM | POA: Diagnosis not present

## 2016-11-19 DIAGNOSIS — R1312 Dysphagia, oropharyngeal phase: Secondary | ICD-10-CM | POA: Diagnosis not present

## 2016-11-19 DIAGNOSIS — M6281 Muscle weakness (generalized): Secondary | ICD-10-CM | POA: Diagnosis not present

## 2016-11-19 DIAGNOSIS — R41841 Cognitive communication deficit: Secondary | ICD-10-CM | POA: Diagnosis not present

## 2016-11-19 DIAGNOSIS — Z8673 Personal history of transient ischemic attack (TIA), and cerebral infarction without residual deficits: Secondary | ICD-10-CM | POA: Diagnosis not present

## 2016-11-19 DIAGNOSIS — R471 Dysarthria and anarthria: Secondary | ICD-10-CM | POA: Diagnosis not present

## 2016-11-20 DIAGNOSIS — R1312 Dysphagia, oropharyngeal phase: Secondary | ICD-10-CM | POA: Diagnosis not present

## 2016-11-20 DIAGNOSIS — Z8673 Personal history of transient ischemic attack (TIA), and cerebral infarction without residual deficits: Secondary | ICD-10-CM | POA: Diagnosis not present

## 2016-11-20 DIAGNOSIS — R41841 Cognitive communication deficit: Secondary | ICD-10-CM | POA: Diagnosis not present

## 2016-11-20 DIAGNOSIS — R2681 Unsteadiness on feet: Secondary | ICD-10-CM | POA: Diagnosis not present

## 2016-11-20 DIAGNOSIS — R471 Dysarthria and anarthria: Secondary | ICD-10-CM | POA: Diagnosis not present

## 2016-11-20 DIAGNOSIS — M6281 Muscle weakness (generalized): Secondary | ICD-10-CM | POA: Diagnosis not present

## 2016-11-22 DIAGNOSIS — R41841 Cognitive communication deficit: Secondary | ICD-10-CM | POA: Diagnosis not present

## 2016-11-22 DIAGNOSIS — Z8673 Personal history of transient ischemic attack (TIA), and cerebral infarction without residual deficits: Secondary | ICD-10-CM | POA: Diagnosis not present

## 2016-11-22 DIAGNOSIS — R1312 Dysphagia, oropharyngeal phase: Secondary | ICD-10-CM | POA: Diagnosis not present

## 2016-11-22 DIAGNOSIS — M6281 Muscle weakness (generalized): Secondary | ICD-10-CM | POA: Diagnosis not present

## 2016-11-22 DIAGNOSIS — R2681 Unsteadiness on feet: Secondary | ICD-10-CM | POA: Diagnosis not present

## 2016-11-22 DIAGNOSIS — R471 Dysarthria and anarthria: Secondary | ICD-10-CM | POA: Diagnosis not present

## 2016-11-23 DIAGNOSIS — R2681 Unsteadiness on feet: Secondary | ICD-10-CM | POA: Diagnosis not present

## 2016-11-23 DIAGNOSIS — Z8673 Personal history of transient ischemic attack (TIA), and cerebral infarction without residual deficits: Secondary | ICD-10-CM | POA: Diagnosis not present

## 2016-11-23 DIAGNOSIS — R471 Dysarthria and anarthria: Secondary | ICD-10-CM | POA: Diagnosis not present

## 2016-11-23 DIAGNOSIS — R1312 Dysphagia, oropharyngeal phase: Secondary | ICD-10-CM | POA: Diagnosis not present

## 2016-11-23 DIAGNOSIS — M6281 Muscle weakness (generalized): Secondary | ICD-10-CM | POA: Diagnosis not present

## 2016-11-23 DIAGNOSIS — R41841 Cognitive communication deficit: Secondary | ICD-10-CM | POA: Diagnosis not present

## 2016-11-24 DIAGNOSIS — R41841 Cognitive communication deficit: Secondary | ICD-10-CM | POA: Diagnosis not present

## 2016-11-24 DIAGNOSIS — R471 Dysarthria and anarthria: Secondary | ICD-10-CM | POA: Diagnosis not present

## 2016-11-24 DIAGNOSIS — R2681 Unsteadiness on feet: Secondary | ICD-10-CM | POA: Diagnosis not present

## 2016-11-24 DIAGNOSIS — M6281 Muscle weakness (generalized): Secondary | ICD-10-CM | POA: Diagnosis not present

## 2016-11-24 DIAGNOSIS — R1312 Dysphagia, oropharyngeal phase: Secondary | ICD-10-CM | POA: Diagnosis not present

## 2016-11-24 DIAGNOSIS — Z8673 Personal history of transient ischemic attack (TIA), and cerebral infarction without residual deficits: Secondary | ICD-10-CM | POA: Diagnosis not present

## 2016-11-26 DIAGNOSIS — R1312 Dysphagia, oropharyngeal phase: Secondary | ICD-10-CM | POA: Diagnosis not present

## 2016-11-26 DIAGNOSIS — Z8673 Personal history of transient ischemic attack (TIA), and cerebral infarction without residual deficits: Secondary | ICD-10-CM | POA: Diagnosis not present

## 2016-11-26 DIAGNOSIS — R471 Dysarthria and anarthria: Secondary | ICD-10-CM | POA: Diagnosis not present

## 2016-11-26 DIAGNOSIS — M6281 Muscle weakness (generalized): Secondary | ICD-10-CM | POA: Diagnosis not present

## 2016-11-26 DIAGNOSIS — R2681 Unsteadiness on feet: Secondary | ICD-10-CM | POA: Diagnosis not present

## 2016-11-26 DIAGNOSIS — R41841 Cognitive communication deficit: Secondary | ICD-10-CM | POA: Diagnosis not present

## 2016-11-27 DIAGNOSIS — R2681 Unsteadiness on feet: Secondary | ICD-10-CM | POA: Diagnosis not present

## 2016-11-27 DIAGNOSIS — R471 Dysarthria and anarthria: Secondary | ICD-10-CM | POA: Diagnosis not present

## 2016-11-27 DIAGNOSIS — R1312 Dysphagia, oropharyngeal phase: Secondary | ICD-10-CM | POA: Diagnosis not present

## 2016-11-27 DIAGNOSIS — M6281 Muscle weakness (generalized): Secondary | ICD-10-CM | POA: Diagnosis not present

## 2016-11-27 DIAGNOSIS — R41841 Cognitive communication deficit: Secondary | ICD-10-CM | POA: Diagnosis not present

## 2016-11-27 DIAGNOSIS — Z8673 Personal history of transient ischemic attack (TIA), and cerebral infarction without residual deficits: Secondary | ICD-10-CM | POA: Diagnosis not present

## 2016-11-28 DIAGNOSIS — R2681 Unsteadiness on feet: Secondary | ICD-10-CM | POA: Diagnosis not present

## 2016-11-28 DIAGNOSIS — R1312 Dysphagia, oropharyngeal phase: Secondary | ICD-10-CM | POA: Diagnosis not present

## 2016-11-28 DIAGNOSIS — Z8673 Personal history of transient ischemic attack (TIA), and cerebral infarction without residual deficits: Secondary | ICD-10-CM | POA: Diagnosis not present

## 2016-11-28 DIAGNOSIS — R471 Dysarthria and anarthria: Secondary | ICD-10-CM | POA: Diagnosis not present

## 2016-11-28 DIAGNOSIS — R41841 Cognitive communication deficit: Secondary | ICD-10-CM | POA: Diagnosis not present

## 2016-11-28 DIAGNOSIS — M6281 Muscle weakness (generalized): Secondary | ICD-10-CM | POA: Diagnosis not present

## 2016-11-29 DIAGNOSIS — M6281 Muscle weakness (generalized): Secondary | ICD-10-CM | POA: Diagnosis not present

## 2016-11-29 DIAGNOSIS — R1312 Dysphagia, oropharyngeal phase: Secondary | ICD-10-CM | POA: Diagnosis not present

## 2016-11-29 DIAGNOSIS — R471 Dysarthria and anarthria: Secondary | ICD-10-CM | POA: Diagnosis not present

## 2016-11-29 DIAGNOSIS — R41841 Cognitive communication deficit: Secondary | ICD-10-CM | POA: Diagnosis not present

## 2016-11-29 DIAGNOSIS — Z8673 Personal history of transient ischemic attack (TIA), and cerebral infarction without residual deficits: Secondary | ICD-10-CM | POA: Diagnosis not present

## 2016-11-29 DIAGNOSIS — R2681 Unsteadiness on feet: Secondary | ICD-10-CM | POA: Diagnosis not present

## 2016-11-30 DIAGNOSIS — R1312 Dysphagia, oropharyngeal phase: Secondary | ICD-10-CM | POA: Diagnosis not present

## 2016-11-30 DIAGNOSIS — R41841 Cognitive communication deficit: Secondary | ICD-10-CM | POA: Diagnosis not present

## 2016-11-30 DIAGNOSIS — R2681 Unsteadiness on feet: Secondary | ICD-10-CM | POA: Diagnosis not present

## 2016-11-30 DIAGNOSIS — R471 Dysarthria and anarthria: Secondary | ICD-10-CM | POA: Diagnosis not present

## 2016-11-30 DIAGNOSIS — Z8673 Personal history of transient ischemic attack (TIA), and cerebral infarction without residual deficits: Secondary | ICD-10-CM | POA: Diagnosis not present

## 2016-11-30 DIAGNOSIS — M6281 Muscle weakness (generalized): Secondary | ICD-10-CM | POA: Diagnosis not present

## 2016-12-01 DIAGNOSIS — R471 Dysarthria and anarthria: Secondary | ICD-10-CM | POA: Diagnosis not present

## 2016-12-01 DIAGNOSIS — R1312 Dysphagia, oropharyngeal phase: Secondary | ICD-10-CM | POA: Diagnosis not present

## 2016-12-01 DIAGNOSIS — M6281 Muscle weakness (generalized): Secondary | ICD-10-CM | POA: Diagnosis not present

## 2016-12-01 DIAGNOSIS — Z8673 Personal history of transient ischemic attack (TIA), and cerebral infarction without residual deficits: Secondary | ICD-10-CM | POA: Diagnosis not present

## 2016-12-01 DIAGNOSIS — R2681 Unsteadiness on feet: Secondary | ICD-10-CM | POA: Diagnosis not present

## 2016-12-01 DIAGNOSIS — R41841 Cognitive communication deficit: Secondary | ICD-10-CM | POA: Diagnosis not present

## 2016-12-02 DIAGNOSIS — R471 Dysarthria and anarthria: Secondary | ICD-10-CM | POA: Diagnosis not present

## 2016-12-02 DIAGNOSIS — R41841 Cognitive communication deficit: Secondary | ICD-10-CM | POA: Diagnosis not present

## 2016-12-02 DIAGNOSIS — R1312 Dysphagia, oropharyngeal phase: Secondary | ICD-10-CM | POA: Diagnosis not present

## 2016-12-02 DIAGNOSIS — M6281 Muscle weakness (generalized): Secondary | ICD-10-CM | POA: Diagnosis not present

## 2016-12-02 DIAGNOSIS — R2681 Unsteadiness on feet: Secondary | ICD-10-CM | POA: Diagnosis not present

## 2016-12-02 DIAGNOSIS — Z8673 Personal history of transient ischemic attack (TIA), and cerebral infarction without residual deficits: Secondary | ICD-10-CM | POA: Diagnosis not present

## 2016-12-03 DIAGNOSIS — R2681 Unsteadiness on feet: Secondary | ICD-10-CM | POA: Diagnosis not present

## 2016-12-03 DIAGNOSIS — Z8673 Personal history of transient ischemic attack (TIA), and cerebral infarction without residual deficits: Secondary | ICD-10-CM | POA: Diagnosis not present

## 2016-12-03 DIAGNOSIS — R471 Dysarthria and anarthria: Secondary | ICD-10-CM | POA: Diagnosis not present

## 2016-12-03 DIAGNOSIS — M6281 Muscle weakness (generalized): Secondary | ICD-10-CM | POA: Diagnosis not present

## 2016-12-03 DIAGNOSIS — R1312 Dysphagia, oropharyngeal phase: Secondary | ICD-10-CM | POA: Diagnosis not present

## 2016-12-03 DIAGNOSIS — R41841 Cognitive communication deficit: Secondary | ICD-10-CM | POA: Diagnosis not present

## 2016-12-04 DIAGNOSIS — M6281 Muscle weakness (generalized): Secondary | ICD-10-CM | POA: Diagnosis not present

## 2016-12-04 DIAGNOSIS — Z8673 Personal history of transient ischemic attack (TIA), and cerebral infarction without residual deficits: Secondary | ICD-10-CM | POA: Diagnosis not present

## 2016-12-04 DIAGNOSIS — R2681 Unsteadiness on feet: Secondary | ICD-10-CM | POA: Diagnosis not present

## 2016-12-04 DIAGNOSIS — R1312 Dysphagia, oropharyngeal phase: Secondary | ICD-10-CM | POA: Diagnosis not present

## 2016-12-04 DIAGNOSIS — R41841 Cognitive communication deficit: Secondary | ICD-10-CM | POA: Diagnosis not present

## 2016-12-04 DIAGNOSIS — R471 Dysarthria and anarthria: Secondary | ICD-10-CM | POA: Diagnosis not present

## 2016-12-05 DIAGNOSIS — R471 Dysarthria and anarthria: Secondary | ICD-10-CM | POA: Diagnosis not present

## 2016-12-05 DIAGNOSIS — R1312 Dysphagia, oropharyngeal phase: Secondary | ICD-10-CM | POA: Diagnosis not present

## 2016-12-05 DIAGNOSIS — Z8673 Personal history of transient ischemic attack (TIA), and cerebral infarction without residual deficits: Secondary | ICD-10-CM | POA: Diagnosis not present

## 2016-12-05 DIAGNOSIS — R41841 Cognitive communication deficit: Secondary | ICD-10-CM | POA: Diagnosis not present

## 2016-12-05 DIAGNOSIS — M6281 Muscle weakness (generalized): Secondary | ICD-10-CM | POA: Diagnosis not present

## 2016-12-05 DIAGNOSIS — R2681 Unsteadiness on feet: Secondary | ICD-10-CM | POA: Diagnosis not present

## 2016-12-06 DIAGNOSIS — R1312 Dysphagia, oropharyngeal phase: Secondary | ICD-10-CM | POA: Diagnosis not present

## 2016-12-06 DIAGNOSIS — E119 Type 2 diabetes mellitus without complications: Secondary | ICD-10-CM | POA: Diagnosis not present

## 2016-12-06 DIAGNOSIS — R2681 Unsteadiness on feet: Secondary | ICD-10-CM | POA: Diagnosis not present

## 2016-12-06 DIAGNOSIS — M6281 Muscle weakness (generalized): Secondary | ICD-10-CM | POA: Diagnosis not present

## 2016-12-06 DIAGNOSIS — R41841 Cognitive communication deficit: Secondary | ICD-10-CM | POA: Diagnosis not present

## 2016-12-06 DIAGNOSIS — F0150 Vascular dementia without behavioral disturbance: Secondary | ICD-10-CM | POA: Diagnosis not present

## 2016-12-06 DIAGNOSIS — E782 Mixed hyperlipidemia: Secondary | ICD-10-CM | POA: Diagnosis not present

## 2016-12-06 DIAGNOSIS — Z8673 Personal history of transient ischemic attack (TIA), and cerebral infarction without residual deficits: Secondary | ICD-10-CM | POA: Diagnosis not present

## 2016-12-06 DIAGNOSIS — I10 Essential (primary) hypertension: Secondary | ICD-10-CM | POA: Diagnosis not present

## 2016-12-06 DIAGNOSIS — R471 Dysarthria and anarthria: Secondary | ICD-10-CM | POA: Diagnosis not present

## 2016-12-07 DIAGNOSIS — Z8673 Personal history of transient ischemic attack (TIA), and cerebral infarction without residual deficits: Secondary | ICD-10-CM | POA: Diagnosis not present

## 2016-12-07 DIAGNOSIS — M6281 Muscle weakness (generalized): Secondary | ICD-10-CM | POA: Diagnosis not present

## 2016-12-07 DIAGNOSIS — R1312 Dysphagia, oropharyngeal phase: Secondary | ICD-10-CM | POA: Diagnosis not present

## 2016-12-07 DIAGNOSIS — R2681 Unsteadiness on feet: Secondary | ICD-10-CM | POA: Diagnosis not present

## 2016-12-07 DIAGNOSIS — R41841 Cognitive communication deficit: Secondary | ICD-10-CM | POA: Diagnosis not present

## 2016-12-07 DIAGNOSIS — R471 Dysarthria and anarthria: Secondary | ICD-10-CM | POA: Diagnosis not present

## 2016-12-09 DIAGNOSIS — R2681 Unsteadiness on feet: Secondary | ICD-10-CM | POA: Diagnosis not present

## 2016-12-09 DIAGNOSIS — R471 Dysarthria and anarthria: Secondary | ICD-10-CM | POA: Diagnosis not present

## 2016-12-09 DIAGNOSIS — M6281 Muscle weakness (generalized): Secondary | ICD-10-CM | POA: Diagnosis not present

## 2016-12-09 DIAGNOSIS — R1312 Dysphagia, oropharyngeal phase: Secondary | ICD-10-CM | POA: Diagnosis not present

## 2016-12-09 DIAGNOSIS — R41841 Cognitive communication deficit: Secondary | ICD-10-CM | POA: Diagnosis not present

## 2016-12-09 DIAGNOSIS — Z8673 Personal history of transient ischemic attack (TIA), and cerebral infarction without residual deficits: Secondary | ICD-10-CM | POA: Diagnosis not present

## 2016-12-10 DIAGNOSIS — R41841 Cognitive communication deficit: Secondary | ICD-10-CM | POA: Diagnosis not present

## 2016-12-10 DIAGNOSIS — R2681 Unsteadiness on feet: Secondary | ICD-10-CM | POA: Diagnosis not present

## 2016-12-10 DIAGNOSIS — R471 Dysarthria and anarthria: Secondary | ICD-10-CM | POA: Diagnosis not present

## 2016-12-10 DIAGNOSIS — R1312 Dysphagia, oropharyngeal phase: Secondary | ICD-10-CM | POA: Diagnosis not present

## 2016-12-10 DIAGNOSIS — Z8673 Personal history of transient ischemic attack (TIA), and cerebral infarction without residual deficits: Secondary | ICD-10-CM | POA: Diagnosis not present

## 2016-12-10 DIAGNOSIS — M6281 Muscle weakness (generalized): Secondary | ICD-10-CM | POA: Diagnosis not present

## 2016-12-11 DIAGNOSIS — R1312 Dysphagia, oropharyngeal phase: Secondary | ICD-10-CM | POA: Diagnosis not present

## 2016-12-11 DIAGNOSIS — R471 Dysarthria and anarthria: Secondary | ICD-10-CM | POA: Diagnosis not present

## 2016-12-11 DIAGNOSIS — R41841 Cognitive communication deficit: Secondary | ICD-10-CM | POA: Diagnosis not present

## 2016-12-11 DIAGNOSIS — M6281 Muscle weakness (generalized): Secondary | ICD-10-CM | POA: Diagnosis not present

## 2016-12-11 DIAGNOSIS — Z8673 Personal history of transient ischemic attack (TIA), and cerebral infarction without residual deficits: Secondary | ICD-10-CM | POA: Diagnosis not present

## 2016-12-11 DIAGNOSIS — R2681 Unsteadiness on feet: Secondary | ICD-10-CM | POA: Diagnosis not present

## 2016-12-12 DIAGNOSIS — R471 Dysarthria and anarthria: Secondary | ICD-10-CM | POA: Diagnosis not present

## 2016-12-12 DIAGNOSIS — R2681 Unsteadiness on feet: Secondary | ICD-10-CM | POA: Diagnosis not present

## 2016-12-12 DIAGNOSIS — R41841 Cognitive communication deficit: Secondary | ICD-10-CM | POA: Diagnosis not present

## 2016-12-12 DIAGNOSIS — M6281 Muscle weakness (generalized): Secondary | ICD-10-CM | POA: Diagnosis not present

## 2016-12-12 DIAGNOSIS — Z8673 Personal history of transient ischemic attack (TIA), and cerebral infarction without residual deficits: Secondary | ICD-10-CM | POA: Diagnosis not present

## 2016-12-12 DIAGNOSIS — R1312 Dysphagia, oropharyngeal phase: Secondary | ICD-10-CM | POA: Diagnosis not present

## 2016-12-13 DIAGNOSIS — Z8673 Personal history of transient ischemic attack (TIA), and cerebral infarction without residual deficits: Secondary | ICD-10-CM | POA: Diagnosis not present

## 2016-12-13 DIAGNOSIS — R41841 Cognitive communication deficit: Secondary | ICD-10-CM | POA: Diagnosis not present

## 2016-12-13 DIAGNOSIS — R471 Dysarthria and anarthria: Secondary | ICD-10-CM | POA: Diagnosis not present

## 2016-12-13 DIAGNOSIS — R2681 Unsteadiness on feet: Secondary | ICD-10-CM | POA: Diagnosis not present

## 2016-12-13 DIAGNOSIS — M6281 Muscle weakness (generalized): Secondary | ICD-10-CM | POA: Diagnosis not present

## 2016-12-13 DIAGNOSIS — R1312 Dysphagia, oropharyngeal phase: Secondary | ICD-10-CM | POA: Diagnosis not present

## 2016-12-14 DIAGNOSIS — R2681 Unsteadiness on feet: Secondary | ICD-10-CM | POA: Diagnosis not present

## 2016-12-14 DIAGNOSIS — R1312 Dysphagia, oropharyngeal phase: Secondary | ICD-10-CM | POA: Diagnosis not present

## 2016-12-14 DIAGNOSIS — M6281 Muscle weakness (generalized): Secondary | ICD-10-CM | POA: Diagnosis not present

## 2016-12-14 DIAGNOSIS — R471 Dysarthria and anarthria: Secondary | ICD-10-CM | POA: Diagnosis not present

## 2016-12-14 DIAGNOSIS — Z8673 Personal history of transient ischemic attack (TIA), and cerebral infarction without residual deficits: Secondary | ICD-10-CM | POA: Diagnosis not present

## 2016-12-14 DIAGNOSIS — R4701 Aphasia: Secondary | ICD-10-CM | POA: Diagnosis not present

## 2016-12-14 DIAGNOSIS — R41841 Cognitive communication deficit: Secondary | ICD-10-CM | POA: Diagnosis not present

## 2016-12-14 DIAGNOSIS — F0390 Unspecified dementia without behavioral disturbance: Secondary | ICD-10-CM | POA: Diagnosis not present

## 2016-12-15 DIAGNOSIS — R1312 Dysphagia, oropharyngeal phase: Secondary | ICD-10-CM | POA: Diagnosis not present

## 2016-12-15 DIAGNOSIS — M6281 Muscle weakness (generalized): Secondary | ICD-10-CM | POA: Diagnosis not present

## 2016-12-15 DIAGNOSIS — R471 Dysarthria and anarthria: Secondary | ICD-10-CM | POA: Diagnosis not present

## 2016-12-15 DIAGNOSIS — R41841 Cognitive communication deficit: Secondary | ICD-10-CM | POA: Diagnosis not present

## 2016-12-15 DIAGNOSIS — R2681 Unsteadiness on feet: Secondary | ICD-10-CM | POA: Diagnosis not present

## 2016-12-15 DIAGNOSIS — Z8673 Personal history of transient ischemic attack (TIA), and cerebral infarction without residual deficits: Secondary | ICD-10-CM | POA: Diagnosis not present

## 2016-12-16 DIAGNOSIS — R2681 Unsteadiness on feet: Secondary | ICD-10-CM | POA: Diagnosis not present

## 2016-12-16 DIAGNOSIS — R471 Dysarthria and anarthria: Secondary | ICD-10-CM | POA: Diagnosis not present

## 2016-12-16 DIAGNOSIS — R41841 Cognitive communication deficit: Secondary | ICD-10-CM | POA: Diagnosis not present

## 2016-12-16 DIAGNOSIS — Z8673 Personal history of transient ischemic attack (TIA), and cerebral infarction without residual deficits: Secondary | ICD-10-CM | POA: Diagnosis not present

## 2016-12-16 DIAGNOSIS — R1312 Dysphagia, oropharyngeal phase: Secondary | ICD-10-CM | POA: Diagnosis not present

## 2016-12-16 DIAGNOSIS — M6281 Muscle weakness (generalized): Secondary | ICD-10-CM | POA: Diagnosis not present

## 2016-12-17 DIAGNOSIS — M6281 Muscle weakness (generalized): Secondary | ICD-10-CM | POA: Diagnosis not present

## 2016-12-17 DIAGNOSIS — R2681 Unsteadiness on feet: Secondary | ICD-10-CM | POA: Diagnosis not present

## 2016-12-17 DIAGNOSIS — R471 Dysarthria and anarthria: Secondary | ICD-10-CM | POA: Diagnosis not present

## 2016-12-17 DIAGNOSIS — R1312 Dysphagia, oropharyngeal phase: Secondary | ICD-10-CM | POA: Diagnosis not present

## 2016-12-17 DIAGNOSIS — Z8673 Personal history of transient ischemic attack (TIA), and cerebral infarction without residual deficits: Secondary | ICD-10-CM | POA: Diagnosis not present

## 2016-12-17 DIAGNOSIS — R41841 Cognitive communication deficit: Secondary | ICD-10-CM | POA: Diagnosis not present

## 2016-12-18 DIAGNOSIS — R471 Dysarthria and anarthria: Secondary | ICD-10-CM | POA: Diagnosis not present

## 2016-12-18 DIAGNOSIS — M6281 Muscle weakness (generalized): Secondary | ICD-10-CM | POA: Diagnosis not present

## 2016-12-18 DIAGNOSIS — R41841 Cognitive communication deficit: Secondary | ICD-10-CM | POA: Diagnosis not present

## 2016-12-18 DIAGNOSIS — Z8673 Personal history of transient ischemic attack (TIA), and cerebral infarction without residual deficits: Secondary | ICD-10-CM | POA: Diagnosis not present

## 2016-12-18 DIAGNOSIS — R1312 Dysphagia, oropharyngeal phase: Secondary | ICD-10-CM | POA: Diagnosis not present

## 2016-12-18 DIAGNOSIS — R2681 Unsteadiness on feet: Secondary | ICD-10-CM | POA: Diagnosis not present

## 2016-12-19 DIAGNOSIS — M6281 Muscle weakness (generalized): Secondary | ICD-10-CM | POA: Diagnosis not present

## 2016-12-19 DIAGNOSIS — R1312 Dysphagia, oropharyngeal phase: Secondary | ICD-10-CM | POA: Diagnosis not present

## 2016-12-19 DIAGNOSIS — R471 Dysarthria and anarthria: Secondary | ICD-10-CM | POA: Diagnosis not present

## 2016-12-19 DIAGNOSIS — R41841 Cognitive communication deficit: Secondary | ICD-10-CM | POA: Diagnosis not present

## 2016-12-19 DIAGNOSIS — R2681 Unsteadiness on feet: Secondary | ICD-10-CM | POA: Diagnosis not present

## 2016-12-19 DIAGNOSIS — Z8673 Personal history of transient ischemic attack (TIA), and cerebral infarction without residual deficits: Secondary | ICD-10-CM | POA: Diagnosis not present

## 2016-12-20 DIAGNOSIS — R1312 Dysphagia, oropharyngeal phase: Secondary | ICD-10-CM | POA: Diagnosis not present

## 2016-12-20 DIAGNOSIS — M6281 Muscle weakness (generalized): Secondary | ICD-10-CM | POA: Diagnosis not present

## 2016-12-20 DIAGNOSIS — R2681 Unsteadiness on feet: Secondary | ICD-10-CM | POA: Diagnosis not present

## 2016-12-20 DIAGNOSIS — Z8673 Personal history of transient ischemic attack (TIA), and cerebral infarction without residual deficits: Secondary | ICD-10-CM | POA: Diagnosis not present

## 2016-12-20 DIAGNOSIS — R471 Dysarthria and anarthria: Secondary | ICD-10-CM | POA: Diagnosis not present

## 2016-12-20 DIAGNOSIS — R41841 Cognitive communication deficit: Secondary | ICD-10-CM | POA: Diagnosis not present

## 2016-12-21 DIAGNOSIS — R471 Dysarthria and anarthria: Secondary | ICD-10-CM | POA: Diagnosis not present

## 2016-12-21 DIAGNOSIS — R1312 Dysphagia, oropharyngeal phase: Secondary | ICD-10-CM | POA: Diagnosis not present

## 2016-12-21 DIAGNOSIS — I10 Essential (primary) hypertension: Secondary | ICD-10-CM | POA: Diagnosis not present

## 2016-12-21 DIAGNOSIS — F0150 Vascular dementia without behavioral disturbance: Secondary | ICD-10-CM | POA: Diagnosis not present

## 2016-12-21 DIAGNOSIS — R32 Unspecified urinary incontinence: Secondary | ICD-10-CM | POA: Diagnosis not present

## 2016-12-21 DIAGNOSIS — R41841 Cognitive communication deficit: Secondary | ICD-10-CM | POA: Diagnosis not present

## 2016-12-21 DIAGNOSIS — Z8673 Personal history of transient ischemic attack (TIA), and cerebral infarction without residual deficits: Secondary | ICD-10-CM | POA: Diagnosis not present

## 2016-12-21 DIAGNOSIS — E119 Type 2 diabetes mellitus without complications: Secondary | ICD-10-CM | POA: Diagnosis not present

## 2016-12-21 DIAGNOSIS — R2681 Unsteadiness on feet: Secondary | ICD-10-CM | POA: Diagnosis not present

## 2016-12-21 DIAGNOSIS — M6281 Muscle weakness (generalized): Secondary | ICD-10-CM | POA: Diagnosis not present

## 2016-12-22 DIAGNOSIS — R471 Dysarthria and anarthria: Secondary | ICD-10-CM | POA: Diagnosis not present

## 2016-12-22 DIAGNOSIS — R1312 Dysphagia, oropharyngeal phase: Secondary | ICD-10-CM | POA: Diagnosis not present

## 2016-12-22 DIAGNOSIS — R41841 Cognitive communication deficit: Secondary | ICD-10-CM | POA: Diagnosis not present

## 2016-12-22 DIAGNOSIS — R2681 Unsteadiness on feet: Secondary | ICD-10-CM | POA: Diagnosis not present

## 2016-12-22 DIAGNOSIS — M6281 Muscle weakness (generalized): Secondary | ICD-10-CM | POA: Diagnosis not present

## 2016-12-22 DIAGNOSIS — Z8673 Personal history of transient ischemic attack (TIA), and cerebral infarction without residual deficits: Secondary | ICD-10-CM | POA: Diagnosis not present

## 2016-12-24 DIAGNOSIS — R1312 Dysphagia, oropharyngeal phase: Secondary | ICD-10-CM | POA: Diagnosis not present

## 2016-12-24 DIAGNOSIS — R471 Dysarthria and anarthria: Secondary | ICD-10-CM | POA: Diagnosis not present

## 2016-12-24 DIAGNOSIS — Z8673 Personal history of transient ischemic attack (TIA), and cerebral infarction without residual deficits: Secondary | ICD-10-CM | POA: Diagnosis not present

## 2016-12-24 DIAGNOSIS — R2681 Unsteadiness on feet: Secondary | ICD-10-CM | POA: Diagnosis not present

## 2016-12-24 DIAGNOSIS — R41841 Cognitive communication deficit: Secondary | ICD-10-CM | POA: Diagnosis not present

## 2016-12-24 DIAGNOSIS — M6281 Muscle weakness (generalized): Secondary | ICD-10-CM | POA: Diagnosis not present

## 2016-12-25 DIAGNOSIS — R41841 Cognitive communication deficit: Secondary | ICD-10-CM | POA: Diagnosis not present

## 2016-12-25 DIAGNOSIS — R471 Dysarthria and anarthria: Secondary | ICD-10-CM | POA: Diagnosis not present

## 2016-12-25 DIAGNOSIS — R1312 Dysphagia, oropharyngeal phase: Secondary | ICD-10-CM | POA: Diagnosis not present

## 2016-12-25 DIAGNOSIS — Z8673 Personal history of transient ischemic attack (TIA), and cerebral infarction without residual deficits: Secondary | ICD-10-CM | POA: Diagnosis not present

## 2016-12-25 DIAGNOSIS — R2681 Unsteadiness on feet: Secondary | ICD-10-CM | POA: Diagnosis not present

## 2016-12-25 DIAGNOSIS — M6281 Muscle weakness (generalized): Secondary | ICD-10-CM | POA: Diagnosis not present

## 2016-12-26 DIAGNOSIS — R471 Dysarthria and anarthria: Secondary | ICD-10-CM | POA: Diagnosis not present

## 2016-12-26 DIAGNOSIS — Z8673 Personal history of transient ischemic attack (TIA), and cerebral infarction without residual deficits: Secondary | ICD-10-CM | POA: Diagnosis not present

## 2016-12-26 DIAGNOSIS — R1312 Dysphagia, oropharyngeal phase: Secondary | ICD-10-CM | POA: Diagnosis not present

## 2016-12-26 DIAGNOSIS — R2681 Unsteadiness on feet: Secondary | ICD-10-CM | POA: Diagnosis not present

## 2016-12-26 DIAGNOSIS — R41841 Cognitive communication deficit: Secondary | ICD-10-CM | POA: Diagnosis not present

## 2016-12-26 DIAGNOSIS — M6281 Muscle weakness (generalized): Secondary | ICD-10-CM | POA: Diagnosis not present

## 2016-12-28 DIAGNOSIS — R41841 Cognitive communication deficit: Secondary | ICD-10-CM | POA: Diagnosis not present

## 2016-12-28 DIAGNOSIS — R1312 Dysphagia, oropharyngeal phase: Secondary | ICD-10-CM | POA: Diagnosis not present

## 2016-12-28 DIAGNOSIS — R2681 Unsteadiness on feet: Secondary | ICD-10-CM | POA: Diagnosis not present

## 2016-12-28 DIAGNOSIS — Z8673 Personal history of transient ischemic attack (TIA), and cerebral infarction without residual deficits: Secondary | ICD-10-CM | POA: Diagnosis not present

## 2016-12-28 DIAGNOSIS — R471 Dysarthria and anarthria: Secondary | ICD-10-CM | POA: Diagnosis not present

## 2016-12-28 DIAGNOSIS — M6281 Muscle weakness (generalized): Secondary | ICD-10-CM | POA: Diagnosis not present

## 2016-12-29 DIAGNOSIS — R41841 Cognitive communication deficit: Secondary | ICD-10-CM | POA: Diagnosis not present

## 2016-12-29 DIAGNOSIS — R1312 Dysphagia, oropharyngeal phase: Secondary | ICD-10-CM | POA: Diagnosis not present

## 2016-12-29 DIAGNOSIS — R471 Dysarthria and anarthria: Secondary | ICD-10-CM | POA: Diagnosis not present

## 2016-12-29 DIAGNOSIS — M6281 Muscle weakness (generalized): Secondary | ICD-10-CM | POA: Diagnosis not present

## 2016-12-29 DIAGNOSIS — Z8673 Personal history of transient ischemic attack (TIA), and cerebral infarction without residual deficits: Secondary | ICD-10-CM | POA: Diagnosis not present

## 2016-12-29 DIAGNOSIS — R2681 Unsteadiness on feet: Secondary | ICD-10-CM | POA: Diagnosis not present

## 2017-01-26 DIAGNOSIS — R32 Unspecified urinary incontinence: Secondary | ICD-10-CM | POA: Diagnosis not present

## 2017-01-26 DIAGNOSIS — R5381 Other malaise: Secondary | ICD-10-CM | POA: Diagnosis not present

## 2017-01-26 DIAGNOSIS — E119 Type 2 diabetes mellitus without complications: Secondary | ICD-10-CM | POA: Diagnosis not present

## 2017-01-26 DIAGNOSIS — I10 Essential (primary) hypertension: Secondary | ICD-10-CM | POA: Diagnosis not present

## 2017-02-12 DIAGNOSIS — I69391 Dysphagia following cerebral infarction: Secondary | ICD-10-CM | POA: Diagnosis not present

## 2017-02-12 DIAGNOSIS — R32 Unspecified urinary incontinence: Secondary | ICD-10-CM | POA: Diagnosis not present

## 2017-02-12 DIAGNOSIS — F0150 Vascular dementia without behavioral disturbance: Secondary | ICD-10-CM | POA: Diagnosis not present

## 2017-02-12 DIAGNOSIS — R5381 Other malaise: Secondary | ICD-10-CM | POA: Diagnosis not present

## 2017-03-08 DIAGNOSIS — I10 Essential (primary) hypertension: Secondary | ICD-10-CM | POA: Diagnosis not present

## 2017-03-08 DIAGNOSIS — F0150 Vascular dementia without behavioral disturbance: Secondary | ICD-10-CM | POA: Diagnosis not present

## 2017-03-08 DIAGNOSIS — R2681 Unsteadiness on feet: Secondary | ICD-10-CM | POA: Diagnosis not present

## 2017-03-08 DIAGNOSIS — I69391 Dysphagia following cerebral infarction: Secondary | ICD-10-CM | POA: Diagnosis not present

## 2017-03-13 DIAGNOSIS — E785 Hyperlipidemia, unspecified: Secondary | ICD-10-CM | POA: Diagnosis not present

## 2017-03-13 DIAGNOSIS — D649 Anemia, unspecified: Secondary | ICD-10-CM | POA: Diagnosis not present

## 2017-03-13 DIAGNOSIS — F0390 Unspecified dementia without behavioral disturbance: Secondary | ICD-10-CM | POA: Diagnosis not present

## 2017-03-13 DIAGNOSIS — E119 Type 2 diabetes mellitus without complications: Secondary | ICD-10-CM | POA: Diagnosis not present

## 2017-03-13 DIAGNOSIS — E039 Hypothyroidism, unspecified: Secondary | ICD-10-CM | POA: Diagnosis not present

## 2017-03-13 DIAGNOSIS — E559 Vitamin D deficiency, unspecified: Secondary | ICD-10-CM | POA: Diagnosis not present

## 2017-03-13 DIAGNOSIS — I10 Essential (primary) hypertension: Secondary | ICD-10-CM | POA: Diagnosis not present

## 2017-04-11 DIAGNOSIS — E559 Vitamin D deficiency, unspecified: Secondary | ICD-10-CM | POA: Diagnosis not present

## 2017-04-11 DIAGNOSIS — R2681 Unsteadiness on feet: Secondary | ICD-10-CM | POA: Diagnosis not present

## 2017-04-11 DIAGNOSIS — I69391 Dysphagia following cerebral infarction: Secondary | ICD-10-CM | POA: Diagnosis not present

## 2017-04-11 DIAGNOSIS — F0150 Vascular dementia without behavioral disturbance: Secondary | ICD-10-CM | POA: Diagnosis not present

## 2017-07-03 DIAGNOSIS — I10 Essential (primary) hypertension: Secondary | ICD-10-CM | POA: Diagnosis not present

## 2017-07-03 DIAGNOSIS — F0150 Vascular dementia without behavioral disturbance: Secondary | ICD-10-CM | POA: Diagnosis not present

## 2017-07-03 DIAGNOSIS — E782 Mixed hyperlipidemia: Secondary | ICD-10-CM | POA: Diagnosis not present

## 2017-07-16 DIAGNOSIS — M6281 Muscle weakness (generalized): Secondary | ICD-10-CM | POA: Diagnosis not present

## 2017-07-16 DIAGNOSIS — R1312 Dysphagia, oropharyngeal phase: Secondary | ICD-10-CM | POA: Diagnosis not present

## 2017-07-16 DIAGNOSIS — R471 Dysarthria and anarthria: Secondary | ICD-10-CM | POA: Diagnosis not present

## 2017-07-16 DIAGNOSIS — R2681 Unsteadiness on feet: Secondary | ICD-10-CM | POA: Diagnosis not present

## 2017-07-16 DIAGNOSIS — Z8673 Personal history of transient ischemic attack (TIA), and cerebral infarction without residual deficits: Secondary | ICD-10-CM | POA: Diagnosis not present

## 2017-07-16 DIAGNOSIS — R4701 Aphasia: Secondary | ICD-10-CM | POA: Diagnosis not present

## 2017-07-16 DIAGNOSIS — R41841 Cognitive communication deficit: Secondary | ICD-10-CM | POA: Diagnosis not present

## 2017-07-17 DIAGNOSIS — Z8673 Personal history of transient ischemic attack (TIA), and cerebral infarction without residual deficits: Secondary | ICD-10-CM | POA: Diagnosis not present

## 2017-07-17 DIAGNOSIS — R41841 Cognitive communication deficit: Secondary | ICD-10-CM | POA: Diagnosis not present

## 2017-07-17 DIAGNOSIS — R1312 Dysphagia, oropharyngeal phase: Secondary | ICD-10-CM | POA: Diagnosis not present

## 2017-07-17 DIAGNOSIS — M6281 Muscle weakness (generalized): Secondary | ICD-10-CM | POA: Diagnosis not present

## 2017-07-17 DIAGNOSIS — R2681 Unsteadiness on feet: Secondary | ICD-10-CM | POA: Diagnosis not present

## 2017-07-17 DIAGNOSIS — R471 Dysarthria and anarthria: Secondary | ICD-10-CM | POA: Diagnosis not present

## 2017-07-18 DIAGNOSIS — R41841 Cognitive communication deficit: Secondary | ICD-10-CM | POA: Diagnosis not present

## 2017-07-18 DIAGNOSIS — M6281 Muscle weakness (generalized): Secondary | ICD-10-CM | POA: Diagnosis not present

## 2017-07-18 DIAGNOSIS — R2681 Unsteadiness on feet: Secondary | ICD-10-CM | POA: Diagnosis not present

## 2017-07-18 DIAGNOSIS — Z8673 Personal history of transient ischemic attack (TIA), and cerebral infarction without residual deficits: Secondary | ICD-10-CM | POA: Diagnosis not present

## 2017-07-18 DIAGNOSIS — R1312 Dysphagia, oropharyngeal phase: Secondary | ICD-10-CM | POA: Diagnosis not present

## 2017-07-18 DIAGNOSIS — R471 Dysarthria and anarthria: Secondary | ICD-10-CM | POA: Diagnosis not present

## 2017-07-20 DIAGNOSIS — Z8673 Personal history of transient ischemic attack (TIA), and cerebral infarction without residual deficits: Secondary | ICD-10-CM | POA: Diagnosis not present

## 2017-07-20 DIAGNOSIS — M6281 Muscle weakness (generalized): Secondary | ICD-10-CM | POA: Diagnosis not present

## 2017-07-20 DIAGNOSIS — R2681 Unsteadiness on feet: Secondary | ICD-10-CM | POA: Diagnosis not present

## 2017-07-20 DIAGNOSIS — R1312 Dysphagia, oropharyngeal phase: Secondary | ICD-10-CM | POA: Diagnosis not present

## 2017-07-20 DIAGNOSIS — R471 Dysarthria and anarthria: Secondary | ICD-10-CM | POA: Diagnosis not present

## 2017-07-20 DIAGNOSIS — R41841 Cognitive communication deficit: Secondary | ICD-10-CM | POA: Diagnosis not present

## 2017-07-22 DIAGNOSIS — R2681 Unsteadiness on feet: Secondary | ICD-10-CM | POA: Diagnosis not present

## 2017-07-22 DIAGNOSIS — M6281 Muscle weakness (generalized): Secondary | ICD-10-CM | POA: Diagnosis not present

## 2017-07-22 DIAGNOSIS — Z8673 Personal history of transient ischemic attack (TIA), and cerebral infarction without residual deficits: Secondary | ICD-10-CM | POA: Diagnosis not present

## 2017-07-22 DIAGNOSIS — R1312 Dysphagia, oropharyngeal phase: Secondary | ICD-10-CM | POA: Diagnosis not present

## 2017-07-22 DIAGNOSIS — R41841 Cognitive communication deficit: Secondary | ICD-10-CM | POA: Diagnosis not present

## 2017-07-22 DIAGNOSIS — R471 Dysarthria and anarthria: Secondary | ICD-10-CM | POA: Diagnosis not present

## 2017-07-23 DIAGNOSIS — R1312 Dysphagia, oropharyngeal phase: Secondary | ICD-10-CM | POA: Diagnosis not present

## 2017-07-23 DIAGNOSIS — Z8673 Personal history of transient ischemic attack (TIA), and cerebral infarction without residual deficits: Secondary | ICD-10-CM | POA: Diagnosis not present

## 2017-07-23 DIAGNOSIS — R41841 Cognitive communication deficit: Secondary | ICD-10-CM | POA: Diagnosis not present

## 2017-07-23 DIAGNOSIS — M6281 Muscle weakness (generalized): Secondary | ICD-10-CM | POA: Diagnosis not present

## 2017-07-23 DIAGNOSIS — R471 Dysarthria and anarthria: Secondary | ICD-10-CM | POA: Diagnosis not present

## 2017-07-23 DIAGNOSIS — R2681 Unsteadiness on feet: Secondary | ICD-10-CM | POA: Diagnosis not present

## 2017-07-24 DIAGNOSIS — R41841 Cognitive communication deficit: Secondary | ICD-10-CM | POA: Diagnosis not present

## 2017-07-24 DIAGNOSIS — Z8673 Personal history of transient ischemic attack (TIA), and cerebral infarction without residual deficits: Secondary | ICD-10-CM | POA: Diagnosis not present

## 2017-07-24 DIAGNOSIS — R2681 Unsteadiness on feet: Secondary | ICD-10-CM | POA: Diagnosis not present

## 2017-07-24 DIAGNOSIS — R471 Dysarthria and anarthria: Secondary | ICD-10-CM | POA: Diagnosis not present

## 2017-07-24 DIAGNOSIS — M6281 Muscle weakness (generalized): Secondary | ICD-10-CM | POA: Diagnosis not present

## 2017-07-24 DIAGNOSIS — R1312 Dysphagia, oropharyngeal phase: Secondary | ICD-10-CM | POA: Diagnosis not present

## 2017-07-26 DIAGNOSIS — Z8673 Personal history of transient ischemic attack (TIA), and cerebral infarction without residual deficits: Secondary | ICD-10-CM | POA: Diagnosis not present

## 2017-07-26 DIAGNOSIS — R41841 Cognitive communication deficit: Secondary | ICD-10-CM | POA: Diagnosis not present

## 2017-07-26 DIAGNOSIS — R2681 Unsteadiness on feet: Secondary | ICD-10-CM | POA: Diagnosis not present

## 2017-07-26 DIAGNOSIS — R1312 Dysphagia, oropharyngeal phase: Secondary | ICD-10-CM | POA: Diagnosis not present

## 2017-07-26 DIAGNOSIS — M6281 Muscle weakness (generalized): Secondary | ICD-10-CM | POA: Diagnosis not present

## 2017-07-26 DIAGNOSIS — R471 Dysarthria and anarthria: Secondary | ICD-10-CM | POA: Diagnosis not present

## 2017-07-27 DIAGNOSIS — Z8673 Personal history of transient ischemic attack (TIA), and cerebral infarction without residual deficits: Secondary | ICD-10-CM | POA: Diagnosis not present

## 2017-07-27 DIAGNOSIS — M6281 Muscle weakness (generalized): Secondary | ICD-10-CM | POA: Diagnosis not present

## 2017-07-27 DIAGNOSIS — R471 Dysarthria and anarthria: Secondary | ICD-10-CM | POA: Diagnosis not present

## 2017-07-27 DIAGNOSIS — R41841 Cognitive communication deficit: Secondary | ICD-10-CM | POA: Diagnosis not present

## 2017-07-27 DIAGNOSIS — R1312 Dysphagia, oropharyngeal phase: Secondary | ICD-10-CM | POA: Diagnosis not present

## 2017-07-27 DIAGNOSIS — R2681 Unsteadiness on feet: Secondary | ICD-10-CM | POA: Diagnosis not present

## 2017-07-30 DIAGNOSIS — R41841 Cognitive communication deficit: Secondary | ICD-10-CM | POA: Diagnosis not present

## 2017-07-30 DIAGNOSIS — R471 Dysarthria and anarthria: Secondary | ICD-10-CM | POA: Diagnosis not present

## 2017-07-30 DIAGNOSIS — Z8673 Personal history of transient ischemic attack (TIA), and cerebral infarction without residual deficits: Secondary | ICD-10-CM | POA: Diagnosis not present

## 2017-07-30 DIAGNOSIS — R2681 Unsteadiness on feet: Secondary | ICD-10-CM | POA: Diagnosis not present

## 2017-07-30 DIAGNOSIS — R1312 Dysphagia, oropharyngeal phase: Secondary | ICD-10-CM | POA: Diagnosis not present

## 2017-07-30 DIAGNOSIS — M6281 Muscle weakness (generalized): Secondary | ICD-10-CM | POA: Diagnosis not present

## 2017-07-31 DIAGNOSIS — M6281 Muscle weakness (generalized): Secondary | ICD-10-CM | POA: Diagnosis not present

## 2017-07-31 DIAGNOSIS — R41841 Cognitive communication deficit: Secondary | ICD-10-CM | POA: Diagnosis not present

## 2017-07-31 DIAGNOSIS — Z8673 Personal history of transient ischemic attack (TIA), and cerebral infarction without residual deficits: Secondary | ICD-10-CM | POA: Diagnosis not present

## 2017-07-31 DIAGNOSIS — R1312 Dysphagia, oropharyngeal phase: Secondary | ICD-10-CM | POA: Diagnosis not present

## 2017-07-31 DIAGNOSIS — R471 Dysarthria and anarthria: Secondary | ICD-10-CM | POA: Diagnosis not present

## 2017-07-31 DIAGNOSIS — R2681 Unsteadiness on feet: Secondary | ICD-10-CM | POA: Diagnosis not present

## 2017-08-01 DIAGNOSIS — R471 Dysarthria and anarthria: Secondary | ICD-10-CM | POA: Diagnosis not present

## 2017-08-01 DIAGNOSIS — R41841 Cognitive communication deficit: Secondary | ICD-10-CM | POA: Diagnosis not present

## 2017-08-01 DIAGNOSIS — M6281 Muscle weakness (generalized): Secondary | ICD-10-CM | POA: Diagnosis not present

## 2017-08-01 DIAGNOSIS — R2681 Unsteadiness on feet: Secondary | ICD-10-CM | POA: Diagnosis not present

## 2017-08-01 DIAGNOSIS — R1312 Dysphagia, oropharyngeal phase: Secondary | ICD-10-CM | POA: Diagnosis not present

## 2017-08-01 DIAGNOSIS — Z8673 Personal history of transient ischemic attack (TIA), and cerebral infarction without residual deficits: Secondary | ICD-10-CM | POA: Diagnosis not present

## 2017-08-02 DIAGNOSIS — M6281 Muscle weakness (generalized): Secondary | ICD-10-CM | POA: Diagnosis not present

## 2017-08-02 DIAGNOSIS — R41841 Cognitive communication deficit: Secondary | ICD-10-CM | POA: Diagnosis not present

## 2017-08-02 DIAGNOSIS — R2681 Unsteadiness on feet: Secondary | ICD-10-CM | POA: Diagnosis not present

## 2017-08-02 DIAGNOSIS — Z8673 Personal history of transient ischemic attack (TIA), and cerebral infarction without residual deficits: Secondary | ICD-10-CM | POA: Diagnosis not present

## 2017-08-02 DIAGNOSIS — R1312 Dysphagia, oropharyngeal phase: Secondary | ICD-10-CM | POA: Diagnosis not present

## 2017-08-02 DIAGNOSIS — R471 Dysarthria and anarthria: Secondary | ICD-10-CM | POA: Diagnosis not present

## 2017-08-04 DIAGNOSIS — Z8673 Personal history of transient ischemic attack (TIA), and cerebral infarction without residual deficits: Secondary | ICD-10-CM | POA: Diagnosis not present

## 2017-08-04 DIAGNOSIS — R471 Dysarthria and anarthria: Secondary | ICD-10-CM | POA: Diagnosis not present

## 2017-08-04 DIAGNOSIS — R2681 Unsteadiness on feet: Secondary | ICD-10-CM | POA: Diagnosis not present

## 2017-08-04 DIAGNOSIS — M6281 Muscle weakness (generalized): Secondary | ICD-10-CM | POA: Diagnosis not present

## 2017-08-04 DIAGNOSIS — R1312 Dysphagia, oropharyngeal phase: Secondary | ICD-10-CM | POA: Diagnosis not present

## 2017-08-04 DIAGNOSIS — R41841 Cognitive communication deficit: Secondary | ICD-10-CM | POA: Diagnosis not present

## 2017-08-06 DIAGNOSIS — M6281 Muscle weakness (generalized): Secondary | ICD-10-CM | POA: Diagnosis not present

## 2017-08-06 DIAGNOSIS — R1312 Dysphagia, oropharyngeal phase: Secondary | ICD-10-CM | POA: Diagnosis not present

## 2017-08-06 DIAGNOSIS — R41841 Cognitive communication deficit: Secondary | ICD-10-CM | POA: Diagnosis not present

## 2017-08-06 DIAGNOSIS — R2681 Unsteadiness on feet: Secondary | ICD-10-CM | POA: Diagnosis not present

## 2017-08-06 DIAGNOSIS — Z8673 Personal history of transient ischemic attack (TIA), and cerebral infarction without residual deficits: Secondary | ICD-10-CM | POA: Diagnosis not present

## 2017-08-06 DIAGNOSIS — R471 Dysarthria and anarthria: Secondary | ICD-10-CM | POA: Diagnosis not present

## 2017-08-07 DIAGNOSIS — R41841 Cognitive communication deficit: Secondary | ICD-10-CM | POA: Diagnosis not present

## 2017-08-07 DIAGNOSIS — Z8673 Personal history of transient ischemic attack (TIA), and cerebral infarction without residual deficits: Secondary | ICD-10-CM | POA: Diagnosis not present

## 2017-08-07 DIAGNOSIS — M6281 Muscle weakness (generalized): Secondary | ICD-10-CM | POA: Diagnosis not present

## 2017-08-07 DIAGNOSIS — R1312 Dysphagia, oropharyngeal phase: Secondary | ICD-10-CM | POA: Diagnosis not present

## 2017-08-07 DIAGNOSIS — R2681 Unsteadiness on feet: Secondary | ICD-10-CM | POA: Diagnosis not present

## 2017-08-07 DIAGNOSIS — R471 Dysarthria and anarthria: Secondary | ICD-10-CM | POA: Diagnosis not present

## 2017-08-08 DIAGNOSIS — M6281 Muscle weakness (generalized): Secondary | ICD-10-CM | POA: Diagnosis not present

## 2017-08-08 DIAGNOSIS — Z8673 Personal history of transient ischemic attack (TIA), and cerebral infarction without residual deficits: Secondary | ICD-10-CM | POA: Diagnosis not present

## 2017-08-08 DIAGNOSIS — R471 Dysarthria and anarthria: Secondary | ICD-10-CM | POA: Diagnosis not present

## 2017-08-08 DIAGNOSIS — R1312 Dysphagia, oropharyngeal phase: Secondary | ICD-10-CM | POA: Diagnosis not present

## 2017-08-08 DIAGNOSIS — R2681 Unsteadiness on feet: Secondary | ICD-10-CM | POA: Diagnosis not present

## 2017-08-08 DIAGNOSIS — R41841 Cognitive communication deficit: Secondary | ICD-10-CM | POA: Diagnosis not present

## 2017-08-09 DIAGNOSIS — R471 Dysarthria and anarthria: Secondary | ICD-10-CM | POA: Diagnosis not present

## 2017-08-09 DIAGNOSIS — Z8673 Personal history of transient ischemic attack (TIA), and cerebral infarction without residual deficits: Secondary | ICD-10-CM | POA: Diagnosis not present

## 2017-08-09 DIAGNOSIS — R2681 Unsteadiness on feet: Secondary | ICD-10-CM | POA: Diagnosis not present

## 2017-08-09 DIAGNOSIS — M6281 Muscle weakness (generalized): Secondary | ICD-10-CM | POA: Diagnosis not present

## 2017-08-09 DIAGNOSIS — R1312 Dysphagia, oropharyngeal phase: Secondary | ICD-10-CM | POA: Diagnosis not present

## 2017-08-09 DIAGNOSIS — R41841 Cognitive communication deficit: Secondary | ICD-10-CM | POA: Diagnosis not present

## 2017-08-11 DIAGNOSIS — R2681 Unsteadiness on feet: Secondary | ICD-10-CM | POA: Diagnosis not present

## 2017-08-11 DIAGNOSIS — R471 Dysarthria and anarthria: Secondary | ICD-10-CM | POA: Diagnosis not present

## 2017-08-11 DIAGNOSIS — R1312 Dysphagia, oropharyngeal phase: Secondary | ICD-10-CM | POA: Diagnosis not present

## 2017-08-11 DIAGNOSIS — Z8673 Personal history of transient ischemic attack (TIA), and cerebral infarction without residual deficits: Secondary | ICD-10-CM | POA: Diagnosis not present

## 2017-08-11 DIAGNOSIS — R41841 Cognitive communication deficit: Secondary | ICD-10-CM | POA: Diagnosis not present

## 2017-08-11 DIAGNOSIS — M6281 Muscle weakness (generalized): Secondary | ICD-10-CM | POA: Diagnosis not present

## 2017-08-13 DIAGNOSIS — R2681 Unsteadiness on feet: Secondary | ICD-10-CM | POA: Diagnosis not present

## 2017-08-13 DIAGNOSIS — R1312 Dysphagia, oropharyngeal phase: Secondary | ICD-10-CM | POA: Diagnosis not present

## 2017-08-13 DIAGNOSIS — R471 Dysarthria and anarthria: Secondary | ICD-10-CM | POA: Diagnosis not present

## 2017-08-13 DIAGNOSIS — Z8673 Personal history of transient ischemic attack (TIA), and cerebral infarction without residual deficits: Secondary | ICD-10-CM | POA: Diagnosis not present

## 2017-08-13 DIAGNOSIS — M6281 Muscle weakness (generalized): Secondary | ICD-10-CM | POA: Diagnosis not present

## 2017-08-13 DIAGNOSIS — R41841 Cognitive communication deficit: Secondary | ICD-10-CM | POA: Diagnosis not present

## 2017-08-14 DIAGNOSIS — Z8673 Personal history of transient ischemic attack (TIA), and cerebral infarction without residual deficits: Secondary | ICD-10-CM | POA: Diagnosis not present

## 2017-08-14 DIAGNOSIS — R1312 Dysphagia, oropharyngeal phase: Secondary | ICD-10-CM | POA: Diagnosis not present

## 2017-08-14 DIAGNOSIS — R41841 Cognitive communication deficit: Secondary | ICD-10-CM | POA: Diagnosis not present

## 2017-08-14 DIAGNOSIS — R2681 Unsteadiness on feet: Secondary | ICD-10-CM | POA: Diagnosis not present

## 2017-08-14 DIAGNOSIS — R471 Dysarthria and anarthria: Secondary | ICD-10-CM | POA: Diagnosis not present

## 2017-08-14 DIAGNOSIS — M6281 Muscle weakness (generalized): Secondary | ICD-10-CM | POA: Diagnosis not present

## 2017-08-15 DIAGNOSIS — R1312 Dysphagia, oropharyngeal phase: Secondary | ICD-10-CM | POA: Diagnosis not present

## 2017-08-15 DIAGNOSIS — M6281 Muscle weakness (generalized): Secondary | ICD-10-CM | POA: Diagnosis not present

## 2017-08-15 DIAGNOSIS — R471 Dysarthria and anarthria: Secondary | ICD-10-CM | POA: Diagnosis not present

## 2017-08-15 DIAGNOSIS — Z8673 Personal history of transient ischemic attack (TIA), and cerebral infarction without residual deficits: Secondary | ICD-10-CM | POA: Diagnosis not present

## 2017-08-15 DIAGNOSIS — R2681 Unsteadiness on feet: Secondary | ICD-10-CM | POA: Diagnosis not present

## 2017-08-15 DIAGNOSIS — R41841 Cognitive communication deficit: Secondary | ICD-10-CM | POA: Diagnosis not present

## 2017-08-17 DIAGNOSIS — R2681 Unsteadiness on feet: Secondary | ICD-10-CM | POA: Diagnosis not present

## 2017-08-17 DIAGNOSIS — M6281 Muscle weakness (generalized): Secondary | ICD-10-CM | POA: Diagnosis not present

## 2017-08-17 DIAGNOSIS — R4701 Aphasia: Secondary | ICD-10-CM | POA: Diagnosis not present

## 2017-08-17 DIAGNOSIS — R41841 Cognitive communication deficit: Secondary | ICD-10-CM | POA: Diagnosis not present

## 2017-08-17 DIAGNOSIS — Z8673 Personal history of transient ischemic attack (TIA), and cerebral infarction without residual deficits: Secondary | ICD-10-CM | POA: Diagnosis not present

## 2017-08-17 DIAGNOSIS — R471 Dysarthria and anarthria: Secondary | ICD-10-CM | POA: Diagnosis not present

## 2017-08-17 DIAGNOSIS — R1312 Dysphagia, oropharyngeal phase: Secondary | ICD-10-CM | POA: Diagnosis not present

## 2017-08-18 DIAGNOSIS — Z8673 Personal history of transient ischemic attack (TIA), and cerebral infarction without residual deficits: Secondary | ICD-10-CM | POA: Diagnosis not present

## 2017-08-18 DIAGNOSIS — R471 Dysarthria and anarthria: Secondary | ICD-10-CM | POA: Diagnosis not present

## 2017-08-18 DIAGNOSIS — R2681 Unsteadiness on feet: Secondary | ICD-10-CM | POA: Diagnosis not present

## 2017-08-18 DIAGNOSIS — R41841 Cognitive communication deficit: Secondary | ICD-10-CM | POA: Diagnosis not present

## 2017-08-18 DIAGNOSIS — R1312 Dysphagia, oropharyngeal phase: Secondary | ICD-10-CM | POA: Diagnosis not present

## 2017-08-18 DIAGNOSIS — M6281 Muscle weakness (generalized): Secondary | ICD-10-CM | POA: Diagnosis not present

## 2017-08-20 DIAGNOSIS — M6281 Muscle weakness (generalized): Secondary | ICD-10-CM | POA: Diagnosis not present

## 2017-08-20 DIAGNOSIS — R471 Dysarthria and anarthria: Secondary | ICD-10-CM | POA: Diagnosis not present

## 2017-08-20 DIAGNOSIS — R1312 Dysphagia, oropharyngeal phase: Secondary | ICD-10-CM | POA: Diagnosis not present

## 2017-08-20 DIAGNOSIS — R2681 Unsteadiness on feet: Secondary | ICD-10-CM | POA: Diagnosis not present

## 2017-08-20 DIAGNOSIS — Z8673 Personal history of transient ischemic attack (TIA), and cerebral infarction without residual deficits: Secondary | ICD-10-CM | POA: Diagnosis not present

## 2017-08-20 DIAGNOSIS — R41841 Cognitive communication deficit: Secondary | ICD-10-CM | POA: Diagnosis not present

## 2017-08-21 DIAGNOSIS — E782 Mixed hyperlipidemia: Secondary | ICD-10-CM | POA: Diagnosis not present

## 2017-08-21 DIAGNOSIS — I10 Essential (primary) hypertension: Secondary | ICD-10-CM | POA: Diagnosis not present

## 2017-08-21 DIAGNOSIS — F0150 Vascular dementia without behavioral disturbance: Secondary | ICD-10-CM | POA: Diagnosis not present

## 2017-10-17 DIAGNOSIS — E559 Vitamin D deficiency, unspecified: Secondary | ICD-10-CM | POA: Diagnosis not present

## 2017-10-17 DIAGNOSIS — I10 Essential (primary) hypertension: Secondary | ICD-10-CM | POA: Diagnosis not present

## 2017-10-17 DIAGNOSIS — E782 Mixed hyperlipidemia: Secondary | ICD-10-CM | POA: Diagnosis not present

## 2017-10-17 DIAGNOSIS — E119 Type 2 diabetes mellitus without complications: Secondary | ICD-10-CM | POA: Diagnosis not present

## 2017-10-23 DIAGNOSIS — E785 Hyperlipidemia, unspecified: Secondary | ICD-10-CM | POA: Diagnosis not present

## 2017-10-23 DIAGNOSIS — I10 Essential (primary) hypertension: Secondary | ICD-10-CM | POA: Diagnosis not present

## 2017-10-23 DIAGNOSIS — E559 Vitamin D deficiency, unspecified: Secondary | ICD-10-CM | POA: Diagnosis not present

## 2017-10-23 DIAGNOSIS — D519 Vitamin B12 deficiency anemia, unspecified: Secondary | ICD-10-CM | POA: Diagnosis not present

## 2017-10-23 DIAGNOSIS — D649 Anemia, unspecified: Secondary | ICD-10-CM | POA: Diagnosis not present

## 2017-10-23 DIAGNOSIS — E119 Type 2 diabetes mellitus without complications: Secondary | ICD-10-CM | POA: Diagnosis not present

## 2017-11-01 DIAGNOSIS — E119 Type 2 diabetes mellitus without complications: Secondary | ICD-10-CM | POA: Diagnosis not present

## 2017-11-01 DIAGNOSIS — I10 Essential (primary) hypertension: Secondary | ICD-10-CM | POA: Diagnosis not present

## 2017-11-01 DIAGNOSIS — E785 Hyperlipidemia, unspecified: Secondary | ICD-10-CM | POA: Diagnosis not present

## 2017-11-27 DIAGNOSIS — F329 Major depressive disorder, single episode, unspecified: Secondary | ICD-10-CM | POA: Diagnosis not present

## 2017-11-27 DIAGNOSIS — I10 Essential (primary) hypertension: Secondary | ICD-10-CM | POA: Diagnosis not present

## 2017-11-27 DIAGNOSIS — E119 Type 2 diabetes mellitus without complications: Secondary | ICD-10-CM | POA: Diagnosis not present

## 2017-11-27 DIAGNOSIS — E782 Mixed hyperlipidemia: Secondary | ICD-10-CM | POA: Diagnosis not present

## 2017-11-29 DIAGNOSIS — M79671 Pain in right foot: Secondary | ICD-10-CM | POA: Diagnosis not present

## 2017-12-24 DIAGNOSIS — M6281 Muscle weakness (generalized): Secondary | ICD-10-CM | POA: Diagnosis not present

## 2017-12-24 DIAGNOSIS — Z8673 Personal history of transient ischemic attack (TIA), and cerebral infarction without residual deficits: Secondary | ICD-10-CM | POA: Diagnosis not present

## 2017-12-24 DIAGNOSIS — F0390 Unspecified dementia without behavioral disturbance: Secondary | ICD-10-CM | POA: Diagnosis not present

## 2017-12-25 DIAGNOSIS — Z8673 Personal history of transient ischemic attack (TIA), and cerebral infarction without residual deficits: Secondary | ICD-10-CM | POA: Diagnosis not present

## 2017-12-25 DIAGNOSIS — F0390 Unspecified dementia without behavioral disturbance: Secondary | ICD-10-CM | POA: Diagnosis not present

## 2017-12-25 DIAGNOSIS — M6281 Muscle weakness (generalized): Secondary | ICD-10-CM | POA: Diagnosis not present

## 2017-12-27 DIAGNOSIS — M6281 Muscle weakness (generalized): Secondary | ICD-10-CM | POA: Diagnosis not present

## 2017-12-27 DIAGNOSIS — Z8673 Personal history of transient ischemic attack (TIA), and cerebral infarction without residual deficits: Secondary | ICD-10-CM | POA: Diagnosis not present

## 2017-12-27 DIAGNOSIS — F0390 Unspecified dementia without behavioral disturbance: Secondary | ICD-10-CM | POA: Diagnosis not present

## 2017-12-28 DIAGNOSIS — M6281 Muscle weakness (generalized): Secondary | ICD-10-CM | POA: Diagnosis not present

## 2017-12-28 DIAGNOSIS — F0390 Unspecified dementia without behavioral disturbance: Secondary | ICD-10-CM | POA: Diagnosis not present

## 2017-12-28 DIAGNOSIS — Z8673 Personal history of transient ischemic attack (TIA), and cerebral infarction without residual deficits: Secondary | ICD-10-CM | POA: Diagnosis not present

## 2017-12-29 DIAGNOSIS — M6281 Muscle weakness (generalized): Secondary | ICD-10-CM | POA: Diagnosis not present

## 2017-12-29 DIAGNOSIS — F0390 Unspecified dementia without behavioral disturbance: Secondary | ICD-10-CM | POA: Diagnosis not present

## 2017-12-29 DIAGNOSIS — Z8673 Personal history of transient ischemic attack (TIA), and cerebral infarction without residual deficits: Secondary | ICD-10-CM | POA: Diagnosis not present

## 2017-12-31 DIAGNOSIS — Z8673 Personal history of transient ischemic attack (TIA), and cerebral infarction without residual deficits: Secondary | ICD-10-CM | POA: Diagnosis not present

## 2017-12-31 DIAGNOSIS — M6281 Muscle weakness (generalized): Secondary | ICD-10-CM | POA: Diagnosis not present

## 2017-12-31 DIAGNOSIS — F0390 Unspecified dementia without behavioral disturbance: Secondary | ICD-10-CM | POA: Diagnosis not present

## 2018-01-01 DIAGNOSIS — M6281 Muscle weakness (generalized): Secondary | ICD-10-CM | POA: Diagnosis not present

## 2018-01-01 DIAGNOSIS — F0390 Unspecified dementia without behavioral disturbance: Secondary | ICD-10-CM | POA: Diagnosis not present

## 2018-01-01 DIAGNOSIS — Z8673 Personal history of transient ischemic attack (TIA), and cerebral infarction without residual deficits: Secondary | ICD-10-CM | POA: Diagnosis not present

## 2018-01-02 DIAGNOSIS — M6281 Muscle weakness (generalized): Secondary | ICD-10-CM | POA: Diagnosis not present

## 2018-01-02 DIAGNOSIS — F0390 Unspecified dementia without behavioral disturbance: Secondary | ICD-10-CM | POA: Diagnosis not present

## 2018-01-02 DIAGNOSIS — Z8673 Personal history of transient ischemic attack (TIA), and cerebral infarction without residual deficits: Secondary | ICD-10-CM | POA: Diagnosis not present

## 2018-01-03 DIAGNOSIS — M6281 Muscle weakness (generalized): Secondary | ICD-10-CM | POA: Diagnosis not present

## 2018-01-03 DIAGNOSIS — Z8673 Personal history of transient ischemic attack (TIA), and cerebral infarction without residual deficits: Secondary | ICD-10-CM | POA: Diagnosis not present

## 2018-01-03 DIAGNOSIS — F0390 Unspecified dementia without behavioral disturbance: Secondary | ICD-10-CM | POA: Diagnosis not present

## 2018-01-04 DIAGNOSIS — F0390 Unspecified dementia without behavioral disturbance: Secondary | ICD-10-CM | POA: Diagnosis not present

## 2018-01-04 DIAGNOSIS — Z8673 Personal history of transient ischemic attack (TIA), and cerebral infarction without residual deficits: Secondary | ICD-10-CM | POA: Diagnosis not present

## 2018-01-04 DIAGNOSIS — M6281 Muscle weakness (generalized): Secondary | ICD-10-CM | POA: Diagnosis not present

## 2018-01-07 DIAGNOSIS — M6281 Muscle weakness (generalized): Secondary | ICD-10-CM | POA: Diagnosis not present

## 2018-01-07 DIAGNOSIS — F0390 Unspecified dementia without behavioral disturbance: Secondary | ICD-10-CM | POA: Diagnosis not present

## 2018-01-07 DIAGNOSIS — Z8673 Personal history of transient ischemic attack (TIA), and cerebral infarction without residual deficits: Secondary | ICD-10-CM | POA: Diagnosis not present

## 2018-01-08 DIAGNOSIS — F0390 Unspecified dementia without behavioral disturbance: Secondary | ICD-10-CM | POA: Diagnosis not present

## 2018-01-08 DIAGNOSIS — Z8673 Personal history of transient ischemic attack (TIA), and cerebral infarction without residual deficits: Secondary | ICD-10-CM | POA: Diagnosis not present

## 2018-01-08 DIAGNOSIS — M6281 Muscle weakness (generalized): Secondary | ICD-10-CM | POA: Diagnosis not present

## 2018-01-09 DIAGNOSIS — Z8673 Personal history of transient ischemic attack (TIA), and cerebral infarction without residual deficits: Secondary | ICD-10-CM | POA: Diagnosis not present

## 2018-01-09 DIAGNOSIS — M6281 Muscle weakness (generalized): Secondary | ICD-10-CM | POA: Diagnosis not present

## 2018-01-09 DIAGNOSIS — F0390 Unspecified dementia without behavioral disturbance: Secondary | ICD-10-CM | POA: Diagnosis not present

## 2018-01-10 DIAGNOSIS — F0390 Unspecified dementia without behavioral disturbance: Secondary | ICD-10-CM | POA: Diagnosis not present

## 2018-01-10 DIAGNOSIS — M6281 Muscle weakness (generalized): Secondary | ICD-10-CM | POA: Diagnosis not present

## 2018-01-10 DIAGNOSIS — Z8673 Personal history of transient ischemic attack (TIA), and cerebral infarction without residual deficits: Secondary | ICD-10-CM | POA: Diagnosis not present

## 2018-01-14 DIAGNOSIS — M6281 Muscle weakness (generalized): Secondary | ICD-10-CM | POA: Diagnosis not present

## 2018-01-14 DIAGNOSIS — F0390 Unspecified dementia without behavioral disturbance: Secondary | ICD-10-CM | POA: Diagnosis not present

## 2018-01-14 DIAGNOSIS — Z8673 Personal history of transient ischemic attack (TIA), and cerebral infarction without residual deficits: Secondary | ICD-10-CM | POA: Diagnosis not present

## 2018-01-15 DIAGNOSIS — M6281 Muscle weakness (generalized): Secondary | ICD-10-CM | POA: Diagnosis not present

## 2018-01-15 DIAGNOSIS — F0390 Unspecified dementia without behavioral disturbance: Secondary | ICD-10-CM | POA: Diagnosis not present

## 2018-01-15 DIAGNOSIS — Z8673 Personal history of transient ischemic attack (TIA), and cerebral infarction without residual deficits: Secondary | ICD-10-CM | POA: Diagnosis not present

## 2018-01-17 DIAGNOSIS — M6281 Muscle weakness (generalized): Secondary | ICD-10-CM | POA: Diagnosis not present

## 2018-01-17 DIAGNOSIS — F0390 Unspecified dementia without behavioral disturbance: Secondary | ICD-10-CM | POA: Diagnosis not present

## 2018-01-17 DIAGNOSIS — Z8673 Personal history of transient ischemic attack (TIA), and cerebral infarction without residual deficits: Secondary | ICD-10-CM | POA: Diagnosis not present

## 2018-01-17 IMAGING — RF DG SWALLOWING FUNCTION
13 of 14 series · 13 of 24 positions shown · non-contrast
Comparison: None.

CLINICAL DATA: Dysphagia.

EXAM:
MODIFIED BARIUM SWALLOW
TECHNIQUE: Different consistencies of barium were administered orally to the
patient by the Speech Pathologist. Imaging of the pharynx was
performed in the lateral projection.
FLUOROSCOPY TIME:  Fluoroscopy Time:  3 minutes 6 seconds

[Series 1: run · 1 of 8 frames shown (1 of 2)]
[frame 2/8]
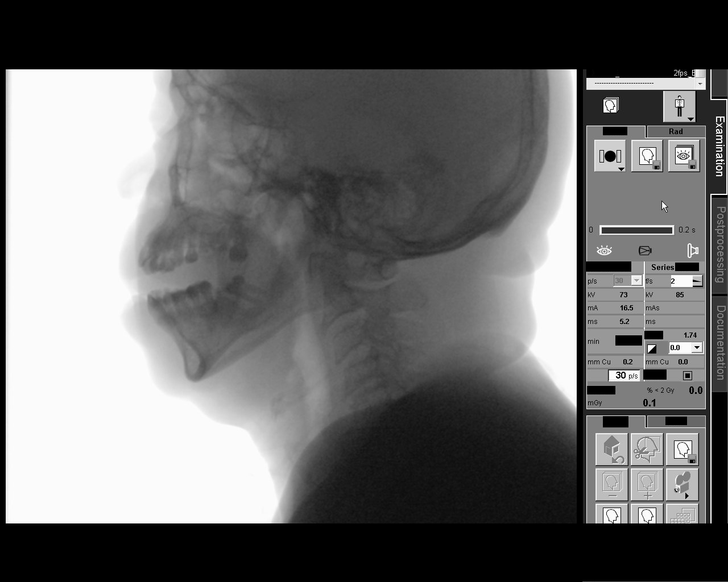

[Series 2: nectar thick -  spoon · 1 of 118 frames shown]
[frame 101/118]
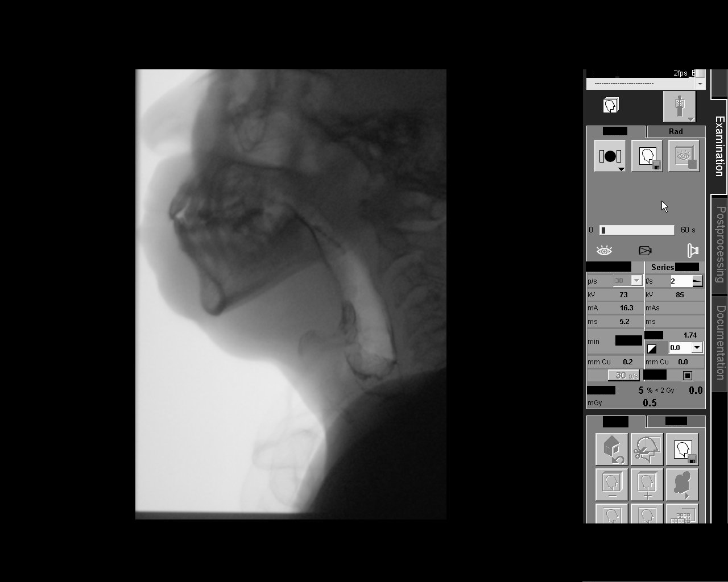

[Series 3: puree · 1 of 427 frames shown (1 of 2)]
[frame 363/427]
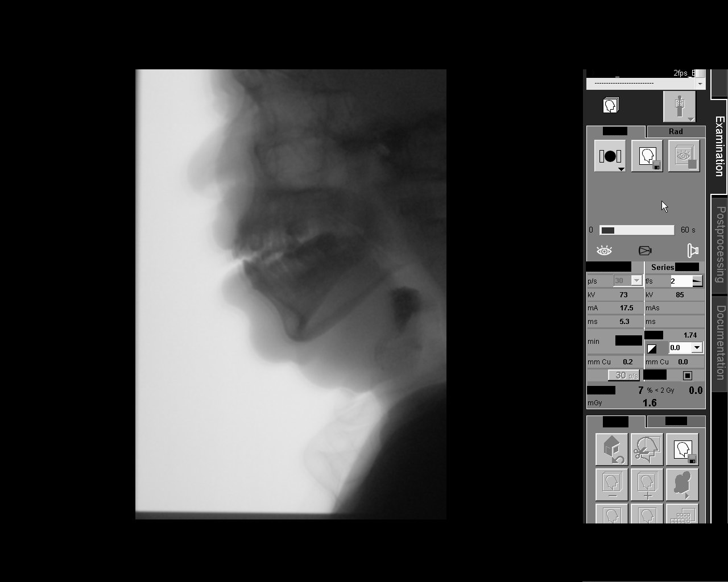

[Series 4: puree · 1 of 299 frames shown (2 of 2)]
[frame 264/299]
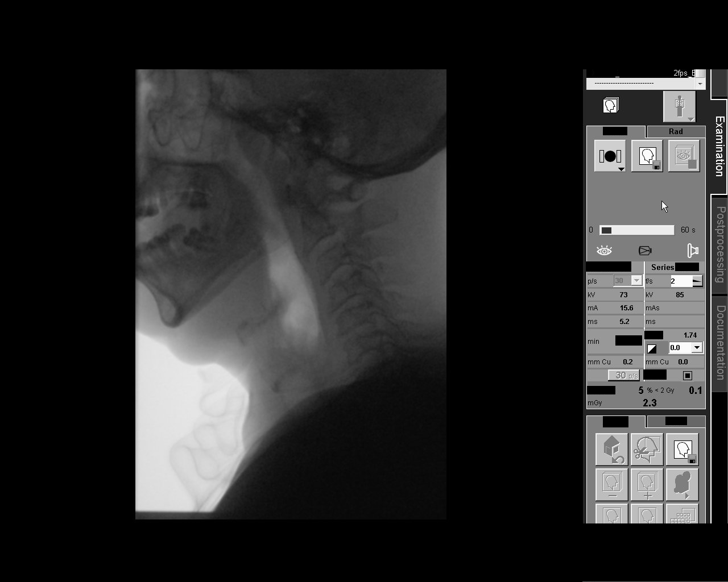

[Series 6: thin liquids - single cup · 1 of 332 frames shown]
[frame 50/332]
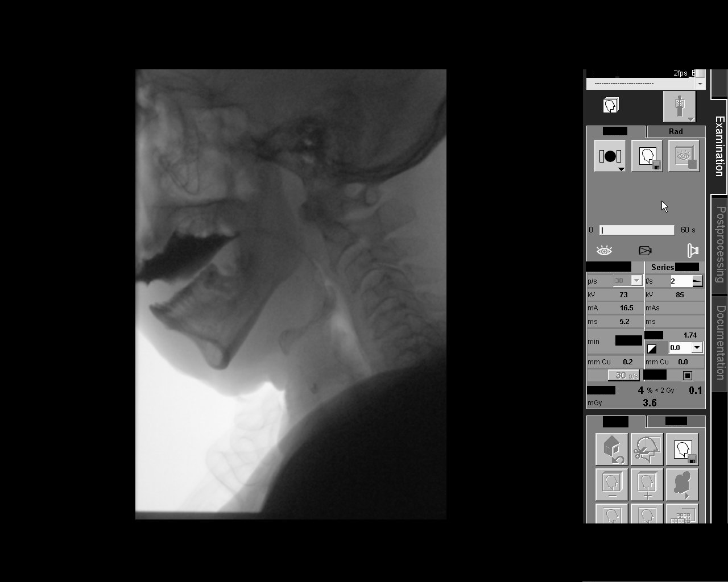

[Series 7: thin liquid - rapid consecutive cup · 1 of 385 frames shown (1 of 2)]
[frame 58/385]
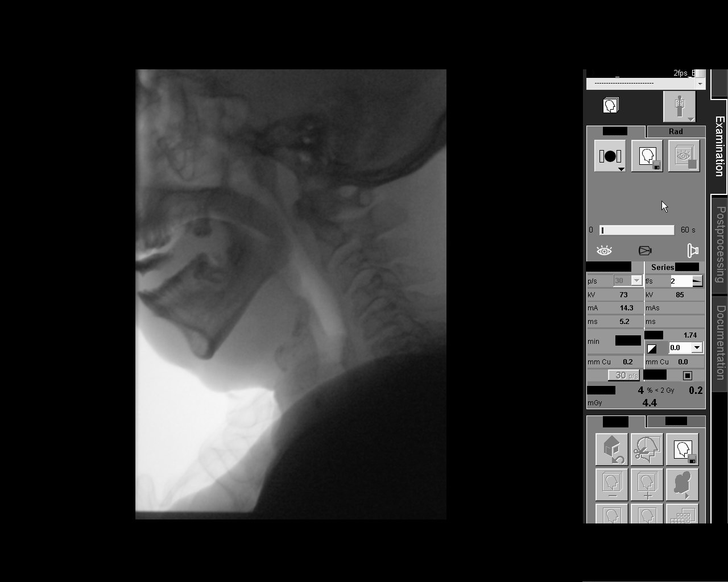

[Series 8: regular solids · 1 of 316 frames shown (1 of 2)]
[frame 159/316]
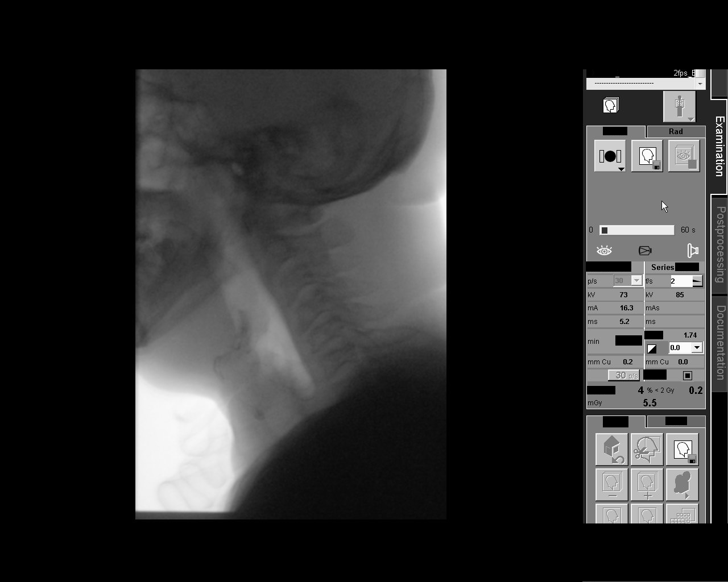

[Series 9: regular solids · 1 of 368 frames shown (2 of 2)]
[frame 28/368]
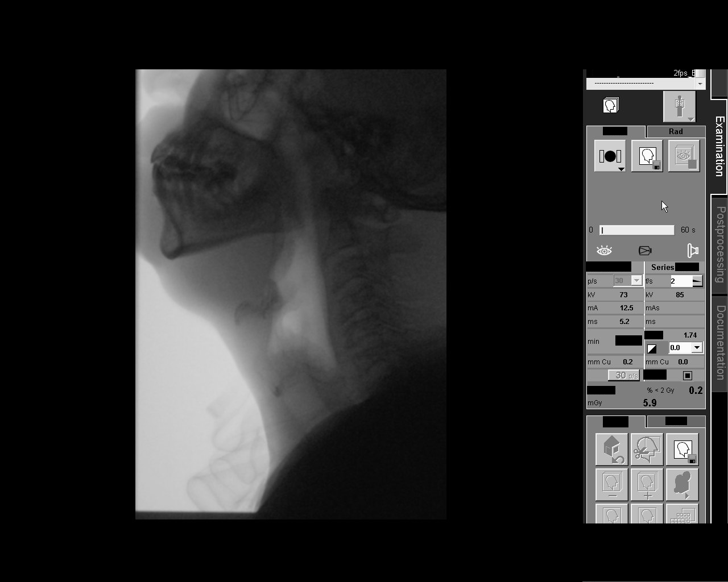

[Series 10: thin liquid - rapid consecutive cup · 1 of 280 frames shown (2 of 2)]
[frame 43/280]
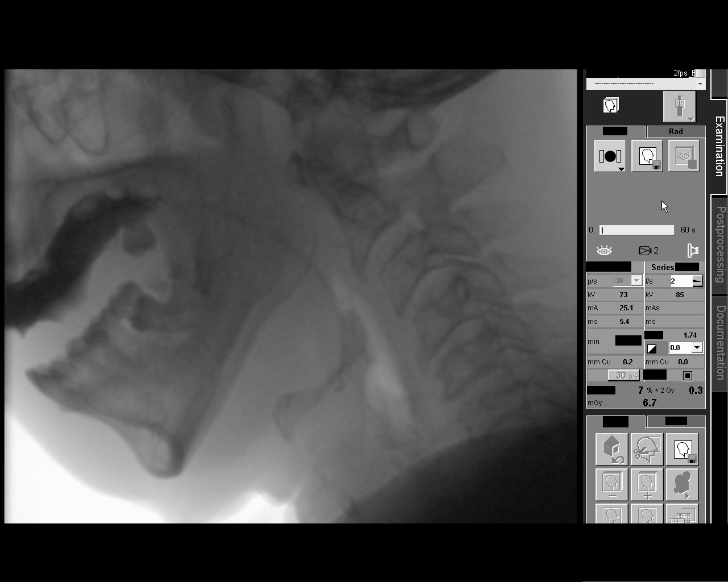

[Series 11: barium tablet- water · 1 of 584 frames shown]
[frame 293/584]
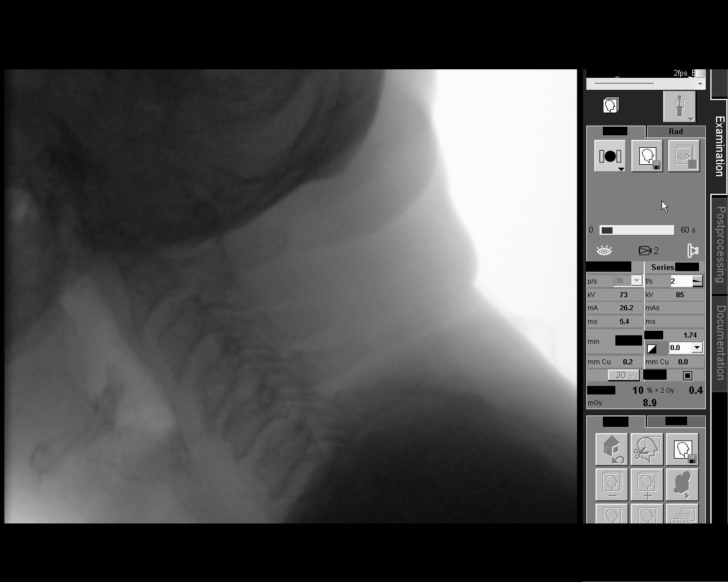

[Series 12: barium tablet in valleculae · 1 of 769 frames shown]
[frame 634/769]
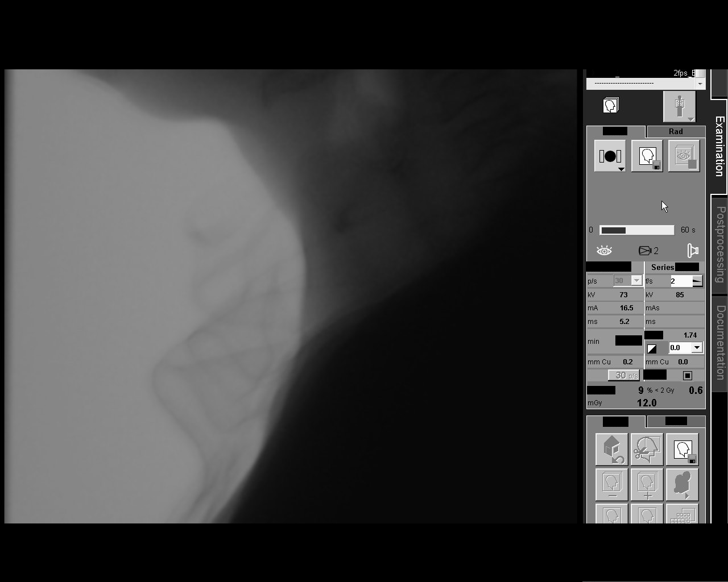

[Series 13: thin liquid - rapid consecutive straw · 1 of 552 frames shown]
[frame 277/552]
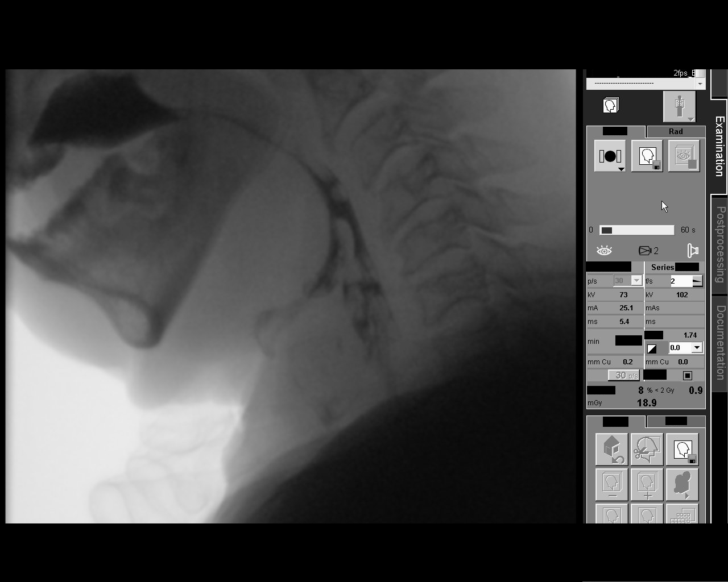

[Series 14: run · 1 of 132 frames shown (2 of 2)]
[frame 113/132]
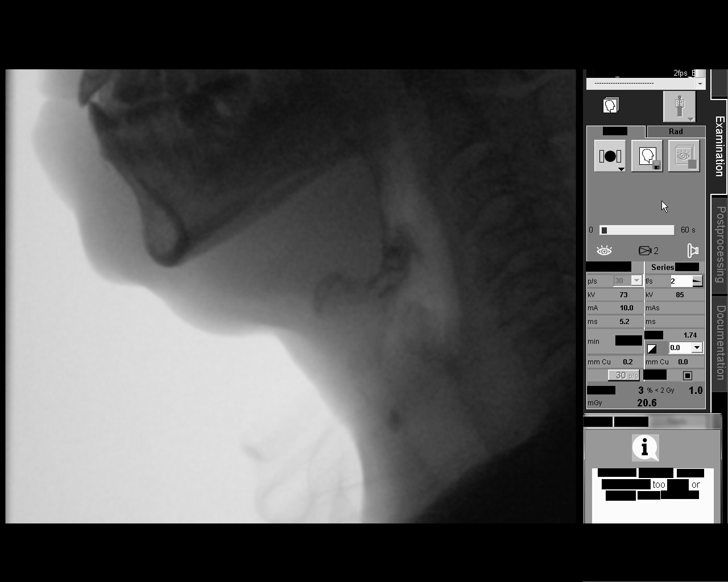

[13 of 24 positions shown; findings below may reference images not displayed]

FINDINGS: Thin liquid- the patient had premature spill and delayed swallow
trigger as well as laryngeal penetration sipping through a straw.

Nectar thick liquid- 1 episode of aspiration. No further episodes
with repeat swallows of nectar.

[place]?Salahuddin within normal limits

[place]?Omer with cracker- premature spill with delayed swallow trigger.

Barium tablet -  within normal limits
IMPRESSION: Single episode of aspiration with nectar thick liquid and a single
episode of laryngeal penetration sipping thin liquid through a
straw.

Please refer to the Speech Pathologists report for complete details
and recommendations.

## 2018-01-18 DIAGNOSIS — M6281 Muscle weakness (generalized): Secondary | ICD-10-CM | POA: Diagnosis not present

## 2018-01-18 DIAGNOSIS — F0390 Unspecified dementia without behavioral disturbance: Secondary | ICD-10-CM | POA: Diagnosis not present

## 2018-01-18 DIAGNOSIS — Z8673 Personal history of transient ischemic attack (TIA), and cerebral infarction without residual deficits: Secondary | ICD-10-CM | POA: Diagnosis not present

## 2018-01-21 DIAGNOSIS — M6281 Muscle weakness (generalized): Secondary | ICD-10-CM | POA: Diagnosis not present

## 2018-01-21 DIAGNOSIS — F0390 Unspecified dementia without behavioral disturbance: Secondary | ICD-10-CM | POA: Diagnosis not present

## 2018-01-21 DIAGNOSIS — Z8673 Personal history of transient ischemic attack (TIA), and cerebral infarction without residual deficits: Secondary | ICD-10-CM | POA: Diagnosis not present

## 2018-01-21 DIAGNOSIS — F0150 Vascular dementia without behavioral disturbance: Secondary | ICD-10-CM | POA: Diagnosis not present

## 2018-01-21 DIAGNOSIS — E782 Mixed hyperlipidemia: Secondary | ICD-10-CM | POA: Diagnosis not present

## 2018-01-21 DIAGNOSIS — I10 Essential (primary) hypertension: Secondary | ICD-10-CM | POA: Diagnosis not present

## 2018-01-21 DIAGNOSIS — R2240 Localized swelling, mass and lump, unspecified lower limb: Secondary | ICD-10-CM | POA: Diagnosis not present

## 2018-01-22 DIAGNOSIS — M6281 Muscle weakness (generalized): Secondary | ICD-10-CM | POA: Diagnosis not present

## 2018-01-22 DIAGNOSIS — Z8673 Personal history of transient ischemic attack (TIA), and cerebral infarction without residual deficits: Secondary | ICD-10-CM | POA: Diagnosis not present

## 2018-01-22 DIAGNOSIS — F0390 Unspecified dementia without behavioral disturbance: Secondary | ICD-10-CM | POA: Diagnosis not present

## 2018-01-23 ENCOUNTER — Other Ambulatory Visit (HOSPITAL_COMMUNITY): Payer: Self-pay | Admitting: Podiatry

## 2018-01-23 DIAGNOSIS — M7989 Other specified soft tissue disorders: Secondary | ICD-10-CM

## 2018-01-23 DIAGNOSIS — Z8673 Personal history of transient ischemic attack (TIA), and cerebral infarction without residual deficits: Secondary | ICD-10-CM | POA: Diagnosis not present

## 2018-01-23 DIAGNOSIS — M6281 Muscle weakness (generalized): Secondary | ICD-10-CM | POA: Diagnosis not present

## 2018-01-23 DIAGNOSIS — F0390 Unspecified dementia without behavioral disturbance: Secondary | ICD-10-CM | POA: Diagnosis not present

## 2018-01-24 DIAGNOSIS — F0390 Unspecified dementia without behavioral disturbance: Secondary | ICD-10-CM | POA: Diagnosis not present

## 2018-01-24 DIAGNOSIS — M6281 Muscle weakness (generalized): Secondary | ICD-10-CM | POA: Diagnosis not present

## 2018-01-24 DIAGNOSIS — Z8673 Personal history of transient ischemic attack (TIA), and cerebral infarction without residual deficits: Secondary | ICD-10-CM | POA: Diagnosis not present

## 2018-01-25 DIAGNOSIS — F0390 Unspecified dementia without behavioral disturbance: Secondary | ICD-10-CM | POA: Diagnosis not present

## 2018-01-25 DIAGNOSIS — M6281 Muscle weakness (generalized): Secondary | ICD-10-CM | POA: Diagnosis not present

## 2018-01-25 DIAGNOSIS — Z8673 Personal history of transient ischemic attack (TIA), and cerebral infarction without residual deficits: Secondary | ICD-10-CM | POA: Diagnosis not present

## 2018-01-28 DIAGNOSIS — F0390 Unspecified dementia without behavioral disturbance: Secondary | ICD-10-CM | POA: Diagnosis not present

## 2018-01-28 DIAGNOSIS — M6281 Muscle weakness (generalized): Secondary | ICD-10-CM | POA: Diagnosis not present

## 2018-01-28 DIAGNOSIS — Z8673 Personal history of transient ischemic attack (TIA), and cerebral infarction without residual deficits: Secondary | ICD-10-CM | POA: Diagnosis not present

## 2018-01-29 ENCOUNTER — Encounter (HOSPITAL_COMMUNITY): Payer: Self-pay

## 2018-01-29 ENCOUNTER — Ambulatory Visit (HOSPITAL_COMMUNITY)
Admission: RE | Admit: 2018-01-29 | Discharge: 2018-01-29 | Disposition: A | Discharge status: Home / Self Care | Payer: Medicare Other | Source: Ambulatory Visit | Attending: Podiatry | Admitting: Podiatry

## 2018-01-29 DIAGNOSIS — F0390 Unspecified dementia without behavioral disturbance: Secondary | ICD-10-CM | POA: Diagnosis not present

## 2018-01-29 DIAGNOSIS — M6281 Muscle weakness (generalized): Secondary | ICD-10-CM | POA: Diagnosis not present

## 2018-01-29 DIAGNOSIS — Z8673 Personal history of transient ischemic attack (TIA), and cerebral infarction without residual deficits: Secondary | ICD-10-CM | POA: Diagnosis not present

## 2018-01-29 DIAGNOSIS — M7989 Other specified soft tissue disorders: Secondary | ICD-10-CM

## 2018-01-30 DIAGNOSIS — M6281 Muscle weakness (generalized): Secondary | ICD-10-CM | POA: Diagnosis not present

## 2018-01-30 DIAGNOSIS — Z8673 Personal history of transient ischemic attack (TIA), and cerebral infarction without residual deficits: Secondary | ICD-10-CM | POA: Diagnosis not present

## 2018-01-30 DIAGNOSIS — F0390 Unspecified dementia without behavioral disturbance: Secondary | ICD-10-CM | POA: Diagnosis not present

## 2018-01-31 DIAGNOSIS — Z8673 Personal history of transient ischemic attack (TIA), and cerebral infarction without residual deficits: Secondary | ICD-10-CM | POA: Diagnosis not present

## 2018-01-31 DIAGNOSIS — F0390 Unspecified dementia without behavioral disturbance: Secondary | ICD-10-CM | POA: Diagnosis not present

## 2018-01-31 DIAGNOSIS — M6281 Muscle weakness (generalized): Secondary | ICD-10-CM | POA: Diagnosis not present

## 2018-02-01 DIAGNOSIS — Z8673 Personal history of transient ischemic attack (TIA), and cerebral infarction without residual deficits: Secondary | ICD-10-CM | POA: Diagnosis not present

## 2018-02-01 DIAGNOSIS — F0390 Unspecified dementia without behavioral disturbance: Secondary | ICD-10-CM | POA: Diagnosis not present

## 2018-02-01 DIAGNOSIS — M6281 Muscle weakness (generalized): Secondary | ICD-10-CM | POA: Diagnosis not present

## 2018-02-04 DIAGNOSIS — F0390 Unspecified dementia without behavioral disturbance: Secondary | ICD-10-CM | POA: Diagnosis not present

## 2018-02-04 DIAGNOSIS — E782 Mixed hyperlipidemia: Secondary | ICD-10-CM | POA: Diagnosis not present

## 2018-02-04 DIAGNOSIS — Z8673 Personal history of transient ischemic attack (TIA), and cerebral infarction without residual deficits: Secondary | ICD-10-CM | POA: Diagnosis not present

## 2018-02-04 DIAGNOSIS — M6281 Muscle weakness (generalized): Secondary | ICD-10-CM | POA: Diagnosis not present

## 2018-02-05 DIAGNOSIS — Z8673 Personal history of transient ischemic attack (TIA), and cerebral infarction without residual deficits: Secondary | ICD-10-CM | POA: Diagnosis not present

## 2018-02-05 DIAGNOSIS — F0390 Unspecified dementia without behavioral disturbance: Secondary | ICD-10-CM | POA: Diagnosis not present

## 2018-02-05 DIAGNOSIS — M6281 Muscle weakness (generalized): Secondary | ICD-10-CM | POA: Diagnosis not present

## 2018-02-06 DIAGNOSIS — M6281 Muscle weakness (generalized): Secondary | ICD-10-CM | POA: Diagnosis not present

## 2018-02-06 DIAGNOSIS — Z8673 Personal history of transient ischemic attack (TIA), and cerebral infarction without residual deficits: Secondary | ICD-10-CM | POA: Diagnosis not present

## 2018-02-06 DIAGNOSIS — F0390 Unspecified dementia without behavioral disturbance: Secondary | ICD-10-CM | POA: Diagnosis not present

## 2018-02-07 DIAGNOSIS — M6281 Muscle weakness (generalized): Secondary | ICD-10-CM | POA: Diagnosis not present

## 2018-02-07 DIAGNOSIS — F0390 Unspecified dementia without behavioral disturbance: Secondary | ICD-10-CM | POA: Diagnosis not present

## 2018-02-07 DIAGNOSIS — Z8673 Personal history of transient ischemic attack (TIA), and cerebral infarction without residual deficits: Secondary | ICD-10-CM | POA: Diagnosis not present

## 2018-02-11 DIAGNOSIS — M6281 Muscle weakness (generalized): Secondary | ICD-10-CM | POA: Diagnosis not present

## 2018-02-11 DIAGNOSIS — F0390 Unspecified dementia without behavioral disturbance: Secondary | ICD-10-CM | POA: Diagnosis not present

## 2018-02-11 DIAGNOSIS — Z8673 Personal history of transient ischemic attack (TIA), and cerebral infarction without residual deficits: Secondary | ICD-10-CM | POA: Diagnosis not present

## 2018-02-12 ENCOUNTER — Other Ambulatory Visit (HOSPITAL_COMMUNITY): Payer: Self-pay | Admitting: Internal Medicine

## 2018-02-12 ENCOUNTER — Other Ambulatory Visit (HOSPITAL_COMMUNITY): Payer: Self-pay | Admitting: Podiatry

## 2018-02-12 DIAGNOSIS — M7989 Other specified soft tissue disorders: Secondary | ICD-10-CM

## 2018-02-14 DIAGNOSIS — Z8673 Personal history of transient ischemic attack (TIA), and cerebral infarction without residual deficits: Secondary | ICD-10-CM | POA: Diagnosis not present

## 2018-02-14 DIAGNOSIS — F0390 Unspecified dementia without behavioral disturbance: Secondary | ICD-10-CM | POA: Diagnosis not present

## 2018-02-14 DIAGNOSIS — M6281 Muscle weakness (generalized): Secondary | ICD-10-CM | POA: Diagnosis not present

## 2018-02-15 ENCOUNTER — Encounter (HOSPITAL_COMMUNITY): Payer: Self-pay | Admitting: *Deleted

## 2018-02-15 ENCOUNTER — Other Ambulatory Visit: Payer: Self-pay

## 2018-02-15 DIAGNOSIS — F0390 Unspecified dementia without behavioral disturbance: Secondary | ICD-10-CM | POA: Diagnosis not present

## 2018-02-15 DIAGNOSIS — Z8673 Personal history of transient ischemic attack (TIA), and cerebral infarction without residual deficits: Secondary | ICD-10-CM | POA: Diagnosis not present

## 2018-02-15 DIAGNOSIS — M6281 Muscle weakness (generalized): Secondary | ICD-10-CM | POA: Diagnosis not present

## 2018-02-15 NOTE — Pre-Procedure Instructions (Addendum)
    Kendarrius Tanzi  02/15/2018               Your procedure is scheduled on Tuesday, May 7.  Report to [facility] Admitting at 5:30 AM                  Your MRI is scheduled for 8:00 AM   Call this number if you have problems the morning of surgery: [phone]- pre- op desk    Remember:  Do not eat food or drink liquids after midnight Monday, May 6.  Take these medicines the morning of surgery with A SIP OF WATER: No Oral medications the morning of surgery. May use Albuterol Inhaler if needed.  Special instructions:  Please send Medication Administration Record with last time medications were administrated documented.              Shower wear clean clothes  Do not wear jewelry, make-up or nail polish.  Do not wear lotions, powders, or perfumes, or deodorant.  Do not shave 48 hours prior to surgery.  Men may shave face and neck.  Do not bring valuables to the hospital.  [facility] is not responsible for any belongings or valuables.  Medication Instructions are from Dr Albertha Ghee

## 2018-02-15 NOTE — Progress Notes (Signed)
I spoke to [place], patient's nurse at [facility]. Marisue Ivan reports that patient is oriented to place and person, "he knows what is going on, he just does not talk." He shakes his head in response at times. Patient is ambulatory. I called Mrs Dillards's number and Cherly Anderson, his daughter's phone numbers, I did not reach either person.

## 2018-02-18 ENCOUNTER — Encounter (HOSPITAL_COMMUNITY): Payer: Self-pay | Admitting: *Deleted

## 2018-02-18 NOTE — Anesthesia Preprocedure Evaluation (Addendum)
Anesthesia Evaluation  Patient identified by MRN, date of birth, ID band Patient confused    Reviewed: Allergy & Precautions, NPO status , Patient's Chart, lab work & pertinent test results  Airway Mallampati: II       Dental  (+) Poor Dentition, Loose   Pulmonary sleep apnea , former smoker,    Pulmonary exam normal breath sounds clear to auscultation       Cardiovascular hypertension, Pt. on medications Normal cardiovascular exam Rhythm:Regular Rate:Normal     Neuro/Psych PSYCHIATRIC DISORDERS Dementia CVA    GI/Hepatic negative GI ROS, Neg liver ROS,   Endo/Other  diabetes  Renal/GU negative Renal ROS     Musculoskeletal   Abdominal Normal abdominal exam  (+)   Peds  Hematology negative hematology ROS (+)   Anesthesia Other Findings Day of surgery medications reviewed with the patient.  Reproductive/Obstetrics                            Anesthesia Physical Anesthesia Plan  ASA: III  Anesthesia Plan: General   Post-op Pain Management:    Induction: Intravenous  PONV Risk Score and Plan:   Airway Management Planned: Oral ETT  Additional Equipment:   Intra-op Plan:   Post-operative Plan: Extubation in OR  Informed Consent: I have reviewed the patients History and Physical, chart, labs and discussed the procedure including the risks, benefits and alternatives for the proposed anesthesia with the patient or authorized representative who has indicated his/her understanding and acceptance.     Plan Discussed with: CRNA and Surgeon  Anesthesia Plan Comments:        Anesthesia Quick Evaluation

## 2018-02-18 NOTE — Progress Notes (Signed)
I spoke to patients wife Anzel Kearse.  Mrs Lowrey confirms that patient is nonverbal, she is not sure of his orientation.  Mrs Angert will be at the hospital by 7:30 AM to sign consent.

## 2018-02-19 ENCOUNTER — Ambulatory Visit (HOSPITAL_COMMUNITY): Payer: Medicare Other | Admitting: Certified Registered"

## 2018-02-19 ENCOUNTER — Encounter (HOSPITAL_COMMUNITY): Admission: RE | Disposition: A | Discharge status: Home / Self Care | Payer: Self-pay | Source: Ambulatory Visit

## 2018-02-19 ENCOUNTER — Ambulatory Visit (HOSPITAL_COMMUNITY)
Admission: RE | Admit: 2018-02-19 | Discharge: 2018-02-19 | Disposition: A | Discharge status: Home / Self Care | Payer: Medicare Other | Source: Ambulatory Visit | Attending: Podiatry | Admitting: Podiatry

## 2018-02-19 ENCOUNTER — Other Ambulatory Visit: Payer: Self-pay

## 2018-02-19 ENCOUNTER — Encounter (HOSPITAL_COMMUNITY): Payer: Self-pay | Admitting: Certified Registered"

## 2018-02-19 DIAGNOSIS — R2241 Localized swelling, mass and lump, right lower limb: Secondary | ICD-10-CM | POA: Insufficient documentation

## 2018-02-19 DIAGNOSIS — G4730 Sleep apnea, unspecified: Secondary | ICD-10-CM | POA: Diagnosis not present

## 2018-02-19 DIAGNOSIS — F0390 Unspecified dementia without behavioral disturbance: Secondary | ICD-10-CM | POA: Insufficient documentation

## 2018-02-19 DIAGNOSIS — G4733 Obstructive sleep apnea (adult) (pediatric): Secondary | ICD-10-CM | POA: Diagnosis not present

## 2018-02-19 DIAGNOSIS — Z87891 Personal history of nicotine dependence: Secondary | ICD-10-CM | POA: Diagnosis not present

## 2018-02-19 DIAGNOSIS — Z8673 Personal history of transient ischemic attack (TIA), and cerebral infarction without residual deficits: Secondary | ICD-10-CM | POA: Diagnosis not present

## 2018-02-19 DIAGNOSIS — I10 Essential (primary) hypertension: Secondary | ICD-10-CM | POA: Diagnosis not present

## 2018-02-19 DIAGNOSIS — E119 Type 2 diabetes mellitus without complications: Secondary | ICD-10-CM | POA: Diagnosis not present

## 2018-02-19 DIAGNOSIS — E785 Hyperlipidemia, unspecified: Secondary | ICD-10-CM | POA: Diagnosis not present

## 2018-02-19 DIAGNOSIS — R600 Localized edema: Secondary | ICD-10-CM | POA: Diagnosis not present

## 2018-02-19 DIAGNOSIS — M7989 Other specified soft tissue disorders: Secondary | ICD-10-CM

## 2018-02-19 HISTORY — DX: Full incontinence of feces: R15.9

## 2018-02-19 HISTORY — DX: Type 2 diabetes mellitus without complications: E11.9

## 2018-02-19 HISTORY — DX: Unspecified urinary incontinence: R32

## 2018-02-19 HISTORY — PX: RADIOLOGY WITH ANESTHESIA: SHX6223

## 2018-02-19 LAB — GLUCOSE, CAPILLARY
GLUCOSE-CAPILLARY: 88 mg/dL (ref 65–99)
Glucose-Capillary: 105 mg/dL — ABNORMAL HIGH (ref 65–99)

## 2018-02-19 LAB — POCT I-STAT 4, (NA,K, GLUC, HGB,HCT)
Glucose, Bld: 108 mg/dL — ABNORMAL HIGH (ref 65–99)
HEMATOCRIT: 38 % — AB (ref 39.0–52.0)
HEMOGLOBIN: 12.9 g/dL — AB (ref 13.0–17.0)
POTASSIUM: 4.8 mmol/L (ref 3.5–5.1)
SODIUM: 146 mmol/L — AB (ref 135–145)

## 2018-02-19 LAB — HEMOGLOBIN A1C
HEMOGLOBIN A1C: 6.3 % — AB (ref 4.8–5.6)
MEAN PLASMA GLUCOSE: 134.11 mg/dL

## 2018-02-19 SURGERY — MRI WITH ANESTHESIA
Anesthesia: General | Laterality: Right

## 2018-02-19 MED ORDER — LACTATED RINGERS IV SOLN
INTRAVENOUS | Status: DC
  Administered 2018-02-19: 08:00:00 via INTRAVENOUS

## 2018-02-19 NOTE — Transfer of Care (Signed)
Immediate Anesthesia Transfer of Care Note  Patient: Seth Neal  Procedure(s) Performed: MRI OF RIGHT FOOT WITH AND WITHOUT CONTRAST (Right )  Patient Location: PACU  Anesthesia Type:General  Level of Consciousness: awake and pateint uncooperative  Airway & Oxygen Therapy: Patient Spontanous Breathing and Patient connected to face mask oxygen  Post-op Assessment: Report given to RN, Post -op Vital signs reviewed and stable and Patient moving all extremities X 4  Post vital signs: Reviewed and stable  Last Vitals:  Vitals Value Taken Time  BP    Temp    Pulse    Resp    SpO2      Last Pain:  Vitals:   02/19/18 0806  TempSrc: Oral  PainSc:          Complications: No apparent anesthesia complications

## 2018-02-19 NOTE — Anesthesia Procedure Notes (Signed)
Procedure Name: Intubation Date/Time: 02/19/2018 9:55 AM Performed by: Gaylene Brooks, CRNA Pre-anesthesia Checklist: Patient identified, Emergency Drugs available, Suction available and Patient being monitored Patient Re-evaluated:Patient Re-evaluated prior to induction Oxygen Delivery Method: Circle System Utilized Preoxygenation: Pre-oxygenation with 100% oxygen Induction Type: IV induction Ventilation: Mask ventilation without difficulty and Oral airway inserted - appropriate to patient size Laryngoscope Size: Sabra Heck and 2 Grade View: Grade II Tube type: Oral Tube size: 7.5 mm Number of attempts: 1 Airway Equipment and Method: Stylet and Oral airway Placement Confirmation: ETT inserted through vocal cords under direct vision,  positive ETCO2 and breath sounds checked- equal and bilateral Secured at: 23 cm Tube secured with: Tape Dental Injury: Teeth and Oropharynx as per pre-operative assessment  Comments: IV induction. Pt with copious secretions. LMA 4 placed but unable to ventilate. [id number]. LMA removed. DL with Sabra Heck 2. Grade 2 view. AOI. +ETCO2, BBS=. Teeth unchanged from preop

## 2018-02-19 NOTE — Progress Notes (Signed)
Received call from main lab that patient's blood collected for bmp was hemolyzed.  Blood was recollected and an istat was drawn.  This blood was also hemolyzed.  Dr. Jillyn Hidden made aware.  No new orders received for lab work.

## 2018-02-20 ENCOUNTER — Encounter (HOSPITAL_COMMUNITY): Payer: Self-pay | Admitting: Radiology

## 2018-02-20 MED FILL — Glycopyrrolate Inj 0.2 MG/ML: INTRAMUSCULAR | Qty: 1 | Status: AC

## 2018-02-20 MED FILL — Propofol IV Emul 200 MG/20ML (10 MG/ML): INTRAVENOUS | Qty: 20 | Status: AC

## 2018-02-20 MED FILL — Lactated Ringer's Solution: INTRAVENOUS | Qty: 1000 | Status: AC

## 2018-02-20 MED FILL — Ondansetron HCl Inj 4 MG/2ML (2 MG/ML): INTRAMUSCULAR | Qty: 2 | Status: AC

## 2018-02-20 MED FILL — Phenylephrine HCl IV Soln 10 MG/ML: INTRAVENOUS | Qty: 20 | Status: AC

## 2018-02-20 MED FILL — Succinylcholine Chloride Sol Pref Syr 200 MG/10ML (20 MG/ML): INTRAVENOUS | Qty: 10 | Status: AC

## 2018-02-20 MED FILL — Lidocaine HCl Local Soln Prefilled Syringe 100 MG/5ML (2%): INTRAMUSCULAR | Qty: 5 | Status: AC

## 2018-02-21 LAB — POCT I-STAT 4, (NA,K, GLUC, HGB,HCT)
Glucose, Bld: 112 mg/dL — ABNORMAL HIGH (ref 65–99)
HCT: 37 % — ABNORMAL LOW (ref 39.0–52.0)
Hemoglobin: 12.6 g/dL — ABNORMAL LOW (ref 13.0–17.0)
POTASSIUM: 6.3 mmol/L — AB (ref 3.5–5.1)
Sodium: 142 mmol/L (ref 135–145)

## 2018-02-22 NOTE — Anesthesia Postprocedure Evaluation (Signed)
Anesthesia Post Note  Patient: Seth Neal  Procedure(s) Performed: MRI OF RIGHT FOOT WITH AND WITHOUT CONTRAST (Right )     Patient location during evaluation: PACU Anesthesia Type: General Level of consciousness: awake and sedated Pain management: pain level controlled Vital Signs Assessment: post-procedure vital signs reviewed and stable Respiratory status: spontaneous breathing Cardiovascular status: stable Postop Assessment: no apparent nausea or vomiting Anesthetic complications: no    Last Vitals:  Vitals:   02/19/18 1150 02/19/18 1201  BP: 113/72 (!) 151/100  Pulse: 100 87  Resp: 18   Temp: 36.5 C 36.7 C  SpO2: 98% 92%    Last Pain:  Vitals:   02/19/18 1201  TempSrc:   PainSc: 0-No pain   Pain Goal:                 Dalores Weger JR,JOHN Terralyn Matsumura

## 2018-02-25 DIAGNOSIS — R2240 Localized swelling, mass and lump, unspecified lower limb: Secondary | ICD-10-CM | POA: Diagnosis not present

## 2018-03-05 ENCOUNTER — Ambulatory Visit (INDEPENDENT_AMBULATORY_CARE_PROVIDER_SITE_OTHER): Payer: Medicare Other | Admitting: Orthopedic Surgery

## 2018-03-05 ENCOUNTER — Encounter (INDEPENDENT_AMBULATORY_CARE_PROVIDER_SITE_OTHER): Payer: Self-pay | Admitting: Orthopedic Surgery

## 2018-03-05 DIAGNOSIS — R2241 Localized swelling, mass and lump, right lower limb: Secondary | ICD-10-CM | POA: Diagnosis not present

## 2018-03-05 NOTE — Progress Notes (Signed)
Office Visit Note   Patient: Seth Neal           Date of Birth: Sep 15, 1956           MRN: [id number] Visit Date: 03/05/2018              Requested by: Tyson Babinski, [place], [place] [address] PCP: System, Pcp Not In  Chief Complaint  Patient presents with  . 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  By report this has been biopsied in the past in [place] and was identified as benign per patient's records.  Patient states that the mass is not painful he denies diabetes but his medical paperwork states that he is diabetic.  Assessment & Plan: Visit Diagnoses:  1. Mass of right foot     Plan: Discussed with the patient and his nurse that we could proceed with excision of the soft tissue mass if the patient's wife wishes to proceed with surgery.  This is a benign mass that is been present for years and does not require resection.  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  Follow-Up Instructions: Return if symptoms worsen or fail to improve.   Ortho Exam  Patient is alert, oriented, no adenopathy, well-dressed, normal affect, normal respiratory effort. Examination patient does have an antalgic gait.  There are no ulcers or any abnormalities in the plantar aspect of his foot there is no skin color or temperature changes no ulcers no blisters no calluses.  He has a soft mobile soft tissue mass in the first webspace which about 2 cm in diameter.  Patient does not have a strong palpable pulse.  Most recent hemoglobin A1c is 6.3.  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  Imaging: No results found. No images are attached to the encounter.  Labs: Lab Results  Component Value Date   HGBA1C 6.3 (H) 02/19/2018   HGBA1C 6.6 07/20/2016   HGBA1C 5.8 03/30/2016     Lab Results  Component Value Date   ALBUMIN 4.2 11/13/2015   ALBUMIN 3.9 05/01/2015   ALBUMIN 3.9 04/16/2014    There is no height or weight on file to calculate BMI.  Orders:  No orders of the defined types were placed in this encounter.  No orders of the defined types were placed in this encounter.    Procedures: No procedures performed  Clinical Data: No additional findings.  ROS:  All other systems negative, except as noted in the HPI. Review of Systems  Objective: Vital Signs: There were no vitals taken for this visit.  Specialty Comments:  No specialty comments available.  PMFS History: Patient Active Problem List   Diagnosis Date Noted  . Diabetes ([place]) 12/04/2015  . Impaired fasting glucose 05/31/2015  . Hyperlipidemia LDL goal <70 05/09/2014  . Essential hypertension, benign 07/09/2013  . Incontinence 07/09/2013  . Multiple lacunar infarcts ([place]) 07/09/2013  . Dementia with behavioral disturbance 07/09/2013  . Prostate hypertrophy 07/09/2013  . Obstructive sleep apnea 07/09/2013  . Change in bowel habits 05/09/2012   Past Medical History:  Diagnosis Date  . Bowel incontinence   . Diabetes mellitus without complication ([place])    No meds and blood sugar  is not checked  . Hyperlipidemia   . Hypertension   . Stroke [facility])    memory, speech   . Urine incontinence     Family History  Problem Relation Age of Onset  . Colon cancer Neg Hx     Past Surgical History:  Procedure Laterality Date  . COLONOSCOPY  05/17/2012   Procedure: COLONOSCOPY;  Surgeon: Danie Binder, MD;  [facility];  Service: Endoscopy;  Laterality: N/A;  12:30  . RADIOLOGY WITH ANESTHESIA Right 02/19/2018   Procedure: MRI OF RIGHT FOOT WITH AND WITHOUT CONTRAST;  Surgeon: Radiologist, Medication, MD;  Location: [place];  Service: Radiology;  Laterality: Right;   Social History   Occupational History  . Not on file    Tobacco Use  . Smoking status: Former Smoker    Packs/day: 1.00    Types: Cigarettes  . Smokeless tobacco: Never Used  . Tobacco comment: quit 2017 ish   Substance and Sexual Activity  . Alcohol use: Not Currently    Comment: occ  . Drug use: No  . Sexual activity: Not on file

## 2018-03-12 DIAGNOSIS — I10 Essential (primary) hypertension: Secondary | ICD-10-CM | POA: Diagnosis not present

## 2018-03-12 DIAGNOSIS — E782 Mixed hyperlipidemia: Secondary | ICD-10-CM | POA: Diagnosis not present

## 2018-03-12 DIAGNOSIS — F0150 Vascular dementia without behavioral disturbance: Secondary | ICD-10-CM | POA: Diagnosis not present

## 2018-04-02 DIAGNOSIS — H2513 Age-related nuclear cataract, bilateral: Secondary | ICD-10-CM | POA: Diagnosis not present

## 2018-04-02 DIAGNOSIS — E119 Type 2 diabetes mellitus without complications: Secondary | ICD-10-CM | POA: Diagnosis not present

## 2018-05-01 DIAGNOSIS — E782 Mixed hyperlipidemia: Secondary | ICD-10-CM | POA: Diagnosis not present

## 2018-05-01 DIAGNOSIS — F0150 Vascular dementia without behavioral disturbance: Secondary | ICD-10-CM | POA: Diagnosis not present

## 2018-05-01 DIAGNOSIS — I10 Essential (primary) hypertension: Secondary | ICD-10-CM | POA: Diagnosis not present

## 2018-06-18 DIAGNOSIS — E785 Hyperlipidemia, unspecified: Secondary | ICD-10-CM | POA: Diagnosis not present

## 2018-06-18 DIAGNOSIS — D649 Anemia, unspecified: Secondary | ICD-10-CM | POA: Diagnosis not present

## 2018-06-18 DIAGNOSIS — I10 Essential (primary) hypertension: Secondary | ICD-10-CM | POA: Diagnosis not present

## 2018-07-02 DIAGNOSIS — I10 Essential (primary) hypertension: Secondary | ICD-10-CM | POA: Diagnosis not present

## 2018-07-02 DIAGNOSIS — F0150 Vascular dementia without behavioral disturbance: Secondary | ICD-10-CM | POA: Diagnosis not present

## 2018-07-02 DIAGNOSIS — E782 Mixed hyperlipidemia: Secondary | ICD-10-CM | POA: Diagnosis not present

## 2018-08-22 DIAGNOSIS — E559 Vitamin D deficiency, unspecified: Secondary | ICD-10-CM | POA: Diagnosis not present

## 2018-08-22 DIAGNOSIS — I10 Essential (primary) hypertension: Secondary | ICD-10-CM | POA: Diagnosis not present

## 2018-08-22 DIAGNOSIS — E782 Mixed hyperlipidemia: Secondary | ICD-10-CM | POA: Diagnosis not present

## 2018-08-22 DIAGNOSIS — E119 Type 2 diabetes mellitus without complications: Secondary | ICD-10-CM | POA: Diagnosis not present

## 2018-08-31 DIAGNOSIS — E119 Type 2 diabetes mellitus without complications: Secondary | ICD-10-CM | POA: Diagnosis not present

## 2018-08-31 DIAGNOSIS — E785 Hyperlipidemia, unspecified: Secondary | ICD-10-CM | POA: Diagnosis not present

## 2018-08-31 DIAGNOSIS — E039 Hypothyroidism, unspecified: Secondary | ICD-10-CM | POA: Diagnosis not present

## 2018-08-31 DIAGNOSIS — D649 Anemia, unspecified: Secondary | ICD-10-CM | POA: Diagnosis not present

## 2018-08-31 DIAGNOSIS — I10 Essential (primary) hypertension: Secondary | ICD-10-CM | POA: Diagnosis not present

## 2018-09-03 DIAGNOSIS — D649 Anemia, unspecified: Secondary | ICD-10-CM | POA: Diagnosis not present

## 2018-09-03 DIAGNOSIS — E782 Mixed hyperlipidemia: Secondary | ICD-10-CM | POA: Diagnosis not present

## 2018-09-03 DIAGNOSIS — E119 Type 2 diabetes mellitus without complications: Secondary | ICD-10-CM | POA: Diagnosis not present

## 2018-09-03 DIAGNOSIS — D72829 Elevated white blood cell count, unspecified: Secondary | ICD-10-CM | POA: Diagnosis not present

## 2018-09-19 DIAGNOSIS — D72829 Elevated white blood cell count, unspecified: Secondary | ICD-10-CM | POA: Diagnosis not present

## 2018-09-23 DIAGNOSIS — H109 Unspecified conjunctivitis: Secondary | ICD-10-CM | POA: Diagnosis not present

## 2018-09-26 DIAGNOSIS — I2510 Atherosclerotic heart disease of native coronary artery without angina pectoris: Secondary | ICD-10-CM | POA: Diagnosis not present

## 2018-09-26 DIAGNOSIS — D72829 Elevated white blood cell count, unspecified: Secondary | ICD-10-CM | POA: Diagnosis not present

## 2018-09-26 DIAGNOSIS — E7800 Pure hypercholesterolemia, unspecified: Secondary | ICD-10-CM | POA: Diagnosis not present

## 2018-09-26 DIAGNOSIS — D649 Anemia, unspecified: Secondary | ICD-10-CM | POA: Diagnosis not present

## 2018-09-26 DIAGNOSIS — M2550 Pain in unspecified joint: Secondary | ICD-10-CM | POA: Diagnosis not present

## 2018-10-03 DIAGNOSIS — D72829 Elevated white blood cell count, unspecified: Secondary | ICD-10-CM | POA: Diagnosis not present

## 2018-10-03 DIAGNOSIS — D649 Anemia, unspecified: Secondary | ICD-10-CM | POA: Diagnosis not present

## 2018-10-03 DIAGNOSIS — H109 Unspecified conjunctivitis: Secondary | ICD-10-CM | POA: Diagnosis not present

## 2018-10-23 DIAGNOSIS — E782 Mixed hyperlipidemia: Secondary | ICD-10-CM | POA: Diagnosis not present

## 2018-10-23 DIAGNOSIS — I10 Essential (primary) hypertension: Secondary | ICD-10-CM | POA: Diagnosis not present

## 2018-10-23 DIAGNOSIS — D72829 Elevated white blood cell count, unspecified: Secondary | ICD-10-CM | POA: Diagnosis not present

## 2018-10-31 DIAGNOSIS — K0889 Other specified disorders of teeth and supporting structures: Secondary | ICD-10-CM | POA: Diagnosis not present

## 2018-11-28 DIAGNOSIS — E119 Type 2 diabetes mellitus without complications: Secondary | ICD-10-CM | POA: Diagnosis not present

## 2018-11-28 DIAGNOSIS — I10 Essential (primary) hypertension: Secondary | ICD-10-CM | POA: Diagnosis not present

## 2018-11-28 DIAGNOSIS — H109 Unspecified conjunctivitis: Secondary | ICD-10-CM | POA: Diagnosis not present

## 2018-11-28 DIAGNOSIS — D649 Anemia, unspecified: Secondary | ICD-10-CM | POA: Diagnosis not present

## 2018-12-05 DIAGNOSIS — I10 Essential (primary) hypertension: Secondary | ICD-10-CM | POA: Diagnosis not present

## 2018-12-05 DIAGNOSIS — F0150 Vascular dementia without behavioral disturbance: Secondary | ICD-10-CM | POA: Diagnosis not present

## 2018-12-05 DIAGNOSIS — E782 Mixed hyperlipidemia: Secondary | ICD-10-CM | POA: Diagnosis not present

## 2018-12-23 DIAGNOSIS — I10 Essential (primary) hypertension: Secondary | ICD-10-CM | POA: Diagnosis not present

## 2018-12-26 DIAGNOSIS — E559 Vitamin D deficiency, unspecified: Secondary | ICD-10-CM | POA: Diagnosis not present

## 2018-12-26 DIAGNOSIS — F0150 Vascular dementia without behavioral disturbance: Secondary | ICD-10-CM | POA: Diagnosis not present

## 2018-12-26 DIAGNOSIS — I10 Essential (primary) hypertension: Secondary | ICD-10-CM | POA: Diagnosis not present

## 2018-12-26 DIAGNOSIS — R4701 Aphasia: Secondary | ICD-10-CM | POA: Diagnosis not present

## 2018-12-30 DIAGNOSIS — J1000 Influenza due to other identified influenza virus with unspecified type of pneumonia: Secondary | ICD-10-CM | POA: Diagnosis not present

## 2019-01-02 DIAGNOSIS — I10 Essential (primary) hypertension: Secondary | ICD-10-CM | POA: Diagnosis not present

## 2019-01-09 DIAGNOSIS — I10 Essential (primary) hypertension: Secondary | ICD-10-CM | POA: Diagnosis not present

## 2019-01-16 DIAGNOSIS — I10 Essential (primary) hypertension: Secondary | ICD-10-CM | POA: Diagnosis not present

## 2019-01-28 DIAGNOSIS — F0150 Vascular dementia without behavioral disturbance: Secondary | ICD-10-CM | POA: Diagnosis not present

## 2019-01-28 DIAGNOSIS — R0602 Shortness of breath: Secondary | ICD-10-CM | POA: Diagnosis not present

## 2019-01-28 DIAGNOSIS — E559 Vitamin D deficiency, unspecified: Secondary | ICD-10-CM | POA: Diagnosis not present

## 2019-01-30 DIAGNOSIS — I10 Essential (primary) hypertension: Secondary | ICD-10-CM | POA: Diagnosis not present

## 2019-01-30 DIAGNOSIS — E119 Type 2 diabetes mellitus without complications: Secondary | ICD-10-CM | POA: Diagnosis not present

## 2019-01-30 DIAGNOSIS — D72829 Elevated white blood cell count, unspecified: Secondary | ICD-10-CM | POA: Diagnosis not present

## 2019-02-03 DIAGNOSIS — E559 Vitamin D deficiency, unspecified: Secondary | ICD-10-CM | POA: Diagnosis not present

## 2019-02-03 DIAGNOSIS — E119 Type 2 diabetes mellitus without complications: Secondary | ICD-10-CM | POA: Diagnosis not present

## 2019-02-03 DIAGNOSIS — I10 Essential (primary) hypertension: Secondary | ICD-10-CM | POA: Diagnosis not present

## 2019-02-03 DIAGNOSIS — D649 Anemia, unspecified: Secondary | ICD-10-CM | POA: Diagnosis not present

## 2019-03-18 DIAGNOSIS — F0390 Unspecified dementia without behavioral disturbance: Secondary | ICD-10-CM | POA: Diagnosis not present

## 2019-03-18 DIAGNOSIS — R279 Unspecified lack of coordination: Secondary | ICD-10-CM | POA: Diagnosis not present

## 2019-03-19 DIAGNOSIS — R279 Unspecified lack of coordination: Secondary | ICD-10-CM | POA: Diagnosis not present

## 2019-03-19 DIAGNOSIS — F0390 Unspecified dementia without behavioral disturbance: Secondary | ICD-10-CM | POA: Diagnosis not present

## 2019-03-20 DIAGNOSIS — E782 Mixed hyperlipidemia: Secondary | ICD-10-CM | POA: Diagnosis not present

## 2019-03-20 DIAGNOSIS — I10 Essential (primary) hypertension: Secondary | ICD-10-CM | POA: Diagnosis not present

## 2019-03-20 DIAGNOSIS — E119 Type 2 diabetes mellitus without complications: Secondary | ICD-10-CM | POA: Diagnosis not present

## 2019-03-20 DIAGNOSIS — D649 Anemia, unspecified: Secondary | ICD-10-CM | POA: Diagnosis not present

## 2019-03-20 DIAGNOSIS — F0390 Unspecified dementia without behavioral disturbance: Secondary | ICD-10-CM | POA: Diagnosis not present

## 2019-03-20 DIAGNOSIS — R279 Unspecified lack of coordination: Secondary | ICD-10-CM | POA: Diagnosis not present

## 2019-03-21 DIAGNOSIS — R279 Unspecified lack of coordination: Secondary | ICD-10-CM | POA: Diagnosis not present

## 2019-03-21 DIAGNOSIS — F0390 Unspecified dementia without behavioral disturbance: Secondary | ICD-10-CM | POA: Diagnosis not present

## 2019-03-24 DIAGNOSIS — F0390 Unspecified dementia without behavioral disturbance: Secondary | ICD-10-CM | POA: Diagnosis not present

## 2019-03-24 DIAGNOSIS — R279 Unspecified lack of coordination: Secondary | ICD-10-CM | POA: Diagnosis not present

## 2019-03-25 DIAGNOSIS — R279 Unspecified lack of coordination: Secondary | ICD-10-CM | POA: Diagnosis not present

## 2019-03-25 DIAGNOSIS — F0390 Unspecified dementia without behavioral disturbance: Secondary | ICD-10-CM | POA: Diagnosis not present

## 2019-03-26 DIAGNOSIS — R279 Unspecified lack of coordination: Secondary | ICD-10-CM | POA: Diagnosis not present

## 2019-03-26 DIAGNOSIS — F0390 Unspecified dementia without behavioral disturbance: Secondary | ICD-10-CM | POA: Diagnosis not present

## 2019-03-28 DIAGNOSIS — R279 Unspecified lack of coordination: Secondary | ICD-10-CM | POA: Diagnosis not present

## 2019-03-28 DIAGNOSIS — F0390 Unspecified dementia without behavioral disturbance: Secondary | ICD-10-CM | POA: Diagnosis not present

## 2019-03-31 DIAGNOSIS — R279 Unspecified lack of coordination: Secondary | ICD-10-CM | POA: Diagnosis not present

## 2019-03-31 DIAGNOSIS — F0390 Unspecified dementia without behavioral disturbance: Secondary | ICD-10-CM | POA: Diagnosis not present

## 2019-04-02 DIAGNOSIS — F0390 Unspecified dementia without behavioral disturbance: Secondary | ICD-10-CM | POA: Diagnosis not present

## 2019-04-02 DIAGNOSIS — R279 Unspecified lack of coordination: Secondary | ICD-10-CM | POA: Diagnosis not present

## 2019-04-03 DIAGNOSIS — F0390 Unspecified dementia without behavioral disturbance: Secondary | ICD-10-CM | POA: Diagnosis not present

## 2019-04-03 DIAGNOSIS — R279 Unspecified lack of coordination: Secondary | ICD-10-CM | POA: Diagnosis not present

## 2019-04-04 DIAGNOSIS — R279 Unspecified lack of coordination: Secondary | ICD-10-CM | POA: Diagnosis not present

## 2019-04-04 DIAGNOSIS — F0390 Unspecified dementia without behavioral disturbance: Secondary | ICD-10-CM | POA: Diagnosis not present

## 2019-04-08 DIAGNOSIS — F0390 Unspecified dementia without behavioral disturbance: Secondary | ICD-10-CM | POA: Diagnosis not present

## 2019-04-08 DIAGNOSIS — R279 Unspecified lack of coordination: Secondary | ICD-10-CM | POA: Diagnosis not present

## 2019-04-11 DIAGNOSIS — R279 Unspecified lack of coordination: Secondary | ICD-10-CM | POA: Diagnosis not present

## 2019-04-11 DIAGNOSIS — F0390 Unspecified dementia without behavioral disturbance: Secondary | ICD-10-CM | POA: Diagnosis not present

## 2019-04-14 DIAGNOSIS — M79675 Pain in left toe(s): Secondary | ICD-10-CM | POA: Diagnosis not present

## 2019-04-14 DIAGNOSIS — F0390 Unspecified dementia without behavioral disturbance: Secondary | ICD-10-CM | POA: Diagnosis not present

## 2019-04-14 DIAGNOSIS — L600 Ingrowing nail: Secondary | ICD-10-CM | POA: Diagnosis not present

## 2019-04-14 DIAGNOSIS — M79674 Pain in right toe(s): Secondary | ICD-10-CM | POA: Diagnosis not present

## 2019-04-14 DIAGNOSIS — B351 Tinea unguium: Secondary | ICD-10-CM | POA: Diagnosis not present

## 2019-04-14 DIAGNOSIS — R279 Unspecified lack of coordination: Secondary | ICD-10-CM | POA: Diagnosis not present

## 2019-05-28 DIAGNOSIS — R0602 Shortness of breath: Secondary | ICD-10-CM | POA: Diagnosis not present

## 2019-05-28 DIAGNOSIS — E782 Mixed hyperlipidemia: Secondary | ICD-10-CM | POA: Diagnosis not present

## 2019-05-28 DIAGNOSIS — I10 Essential (primary) hypertension: Secondary | ICD-10-CM | POA: Diagnosis not present

## 2019-06-19 DIAGNOSIS — E119 Type 2 diabetes mellitus without complications: Secondary | ICD-10-CM | POA: Diagnosis not present

## 2019-06-19 DIAGNOSIS — F0150 Vascular dementia without behavioral disturbance: Secondary | ICD-10-CM | POA: Diagnosis not present

## 2019-06-19 DIAGNOSIS — I10 Essential (primary) hypertension: Secondary | ICD-10-CM | POA: Diagnosis not present

## 2019-06-26 DIAGNOSIS — F0150 Vascular dementia without behavioral disturbance: Secondary | ICD-10-CM | POA: Diagnosis not present

## 2019-06-26 DIAGNOSIS — Z9114 Patient's other noncompliance with medication regimen: Secondary | ICD-10-CM | POA: Diagnosis not present

## 2019-06-30 DIAGNOSIS — B351 Tinea unguium: Secondary | ICD-10-CM | POA: Diagnosis not present

## 2019-06-30 DIAGNOSIS — M79675 Pain in left toe(s): Secondary | ICD-10-CM | POA: Diagnosis not present

## 2019-06-30 DIAGNOSIS — L600 Ingrowing nail: Secondary | ICD-10-CM | POA: Diagnosis not present

## 2019-06-30 DIAGNOSIS — M79674 Pain in right toe(s): Secondary | ICD-10-CM | POA: Diagnosis not present

## 2019-07-01 DIAGNOSIS — Z20828 Contact with and (suspected) exposure to other viral communicable diseases: Secondary | ICD-10-CM | POA: Diagnosis not present

## 2019-07-07 DIAGNOSIS — Z20828 Contact with and (suspected) exposure to other viral communicable diseases: Secondary | ICD-10-CM | POA: Diagnosis not present

## 2019-07-10 DIAGNOSIS — D649 Anemia, unspecified: Secondary | ICD-10-CM | POA: Diagnosis not present

## 2019-07-10 DIAGNOSIS — E782 Mixed hyperlipidemia: Secondary | ICD-10-CM | POA: Diagnosis not present

## 2019-07-10 DIAGNOSIS — I10 Essential (primary) hypertension: Secondary | ICD-10-CM | POA: Diagnosis not present

## 2019-07-10 DIAGNOSIS — E119 Type 2 diabetes mellitus without complications: Secondary | ICD-10-CM | POA: Diagnosis not present

## 2019-07-14 DIAGNOSIS — Z1159 Encounter for screening for other viral diseases: Secondary | ICD-10-CM | POA: Diagnosis not present

## 2019-07-14 DIAGNOSIS — D649 Anemia, unspecified: Secondary | ICD-10-CM | POA: Diagnosis not present

## 2019-07-14 DIAGNOSIS — E785 Hyperlipidemia, unspecified: Secondary | ICD-10-CM | POA: Diagnosis not present

## 2019-07-14 DIAGNOSIS — E119 Type 2 diabetes mellitus without complications: Secondary | ICD-10-CM | POA: Diagnosis not present

## 2019-07-14 DIAGNOSIS — Z20828 Contact with and (suspected) exposure to other viral communicable diseases: Secondary | ICD-10-CM | POA: Diagnosis not present

## 2019-07-14 DIAGNOSIS — D72829 Elevated white blood cell count, unspecified: Secondary | ICD-10-CM | POA: Diagnosis not present

## 2019-07-22 DIAGNOSIS — Z20828 Contact with and (suspected) exposure to other viral communicable diseases: Secondary | ICD-10-CM | POA: Diagnosis not present

## 2019-07-25 DIAGNOSIS — Z20828 Contact with and (suspected) exposure to other viral communicable diseases: Secondary | ICD-10-CM | POA: Diagnosis not present

## 2019-07-30 DIAGNOSIS — Z20828 Contact with and (suspected) exposure to other viral communicable diseases: Secondary | ICD-10-CM | POA: Diagnosis not present

## 2019-08-03 DIAGNOSIS — Z20828 Contact with and (suspected) exposure to other viral communicable diseases: Secondary | ICD-10-CM | POA: Diagnosis not present

## 2019-08-09 DIAGNOSIS — Z20828 Contact with and (suspected) exposure to other viral communicable diseases: Secondary | ICD-10-CM | POA: Diagnosis not present

## 2019-08-11 DIAGNOSIS — D649 Anemia, unspecified: Secondary | ICD-10-CM | POA: Diagnosis not present

## 2019-08-11 DIAGNOSIS — E119 Type 2 diabetes mellitus without complications: Secondary | ICD-10-CM | POA: Diagnosis not present

## 2019-08-11 DIAGNOSIS — E559 Vitamin D deficiency, unspecified: Secondary | ICD-10-CM | POA: Diagnosis not present

## 2019-08-11 DIAGNOSIS — E782 Mixed hyperlipidemia: Secondary | ICD-10-CM | POA: Diagnosis not present

## 2019-08-16 DIAGNOSIS — Z20828 Contact with and (suspected) exposure to other viral communicable diseases: Secondary | ICD-10-CM | POA: Diagnosis not present

## 2019-08-27 DIAGNOSIS — E1151 Type 2 diabetes mellitus with diabetic peripheral angiopathy without gangrene: Secondary | ICD-10-CM | POA: Diagnosis not present

## 2019-08-27 DIAGNOSIS — L84 Corns and callosities: Secondary | ICD-10-CM | POA: Diagnosis not present

## 2019-09-02 DIAGNOSIS — E559 Vitamin D deficiency, unspecified: Secondary | ICD-10-CM | POA: Diagnosis not present

## 2019-09-02 DIAGNOSIS — R0602 Shortness of breath: Secondary | ICD-10-CM | POA: Diagnosis not present

## 2019-09-02 DIAGNOSIS — F0150 Vascular dementia without behavioral disturbance: Secondary | ICD-10-CM | POA: Diagnosis not present

## 2019-09-09 DIAGNOSIS — R7989 Other specified abnormal findings of blood chemistry: Secondary | ICD-10-CM | POA: Diagnosis not present

## 2019-09-09 DIAGNOSIS — E785 Hyperlipidemia, unspecified: Secondary | ICD-10-CM | POA: Diagnosis not present

## 2019-09-15 DIAGNOSIS — E782 Mixed hyperlipidemia: Secondary | ICD-10-CM | POA: Diagnosis not present

## 2019-09-15 DIAGNOSIS — I10 Essential (primary) hypertension: Secondary | ICD-10-CM | POA: Diagnosis not present

## 2019-09-15 DIAGNOSIS — E876 Hypokalemia: Secondary | ICD-10-CM | POA: Diagnosis not present

## 2019-09-18 DIAGNOSIS — E870 Hyperosmolality and hypernatremia: Secondary | ICD-10-CM | POA: Diagnosis not present

## 2019-09-18 DIAGNOSIS — N184 Chronic kidney disease, stage 4 (severe): Secondary | ICD-10-CM | POA: Diagnosis not present

## 2019-09-18 DIAGNOSIS — R630 Anorexia: Secondary | ICD-10-CM | POA: Diagnosis not present

## 2019-09-18 DIAGNOSIS — D649 Anemia, unspecified: Secondary | ICD-10-CM | POA: Diagnosis not present

## 2019-09-18 DIAGNOSIS — F0150 Vascular dementia without behavioral disturbance: Secondary | ICD-10-CM | POA: Diagnosis not present

## 2019-09-18 DIAGNOSIS — E876 Hypokalemia: Secondary | ICD-10-CM | POA: Diagnosis not present

## 2019-09-22 DIAGNOSIS — N390 Urinary tract infection, site not specified: Secondary | ICD-10-CM | POA: Diagnosis not present

## 2019-09-22 DIAGNOSIS — E870 Hyperosmolality and hypernatremia: Secondary | ICD-10-CM | POA: Diagnosis not present

## 2019-09-22 DIAGNOSIS — D509 Iron deficiency anemia, unspecified: Secondary | ICD-10-CM | POA: Diagnosis not present

## 2019-09-22 DIAGNOSIS — R630 Anorexia: Secondary | ICD-10-CM | POA: Diagnosis not present

## 2019-09-22 DIAGNOSIS — D649 Anemia, unspecified: Secondary | ICD-10-CM | POA: Diagnosis not present

## 2019-09-22 DIAGNOSIS — E876 Hypokalemia: Secondary | ICD-10-CM | POA: Diagnosis not present

## 2021-04-27 ENCOUNTER — Emergency Department (HOSPITAL_COMMUNITY): Payer: Medicare Other

## 2021-04-27 ENCOUNTER — Encounter (HOSPITAL_COMMUNITY): Payer: Self-pay

## 2021-04-27 ENCOUNTER — Inpatient Hospital Stay (HOSPITAL_COMMUNITY)
Admission: EM | Admit: 2021-04-27 | Discharge: 2021-04-30 | DRG: 689 | Disposition: A | Discharge status: Skilled Nursing Facility | Payer: Medicare Other | Source: Skilled Nursing Facility | Attending: Family Medicine | Admitting: Family Medicine

## 2021-04-27 DIAGNOSIS — G4733 Obstructive sleep apnea (adult) (pediatric): Secondary | ICD-10-CM | POA: Diagnosis present

## 2021-04-27 DIAGNOSIS — E785 Hyperlipidemia, unspecified: Secondary | ICD-10-CM | POA: Diagnosis present

## 2021-04-27 DIAGNOSIS — N400 Enlarged prostate without lower urinary tract symptoms: Secondary | ICD-10-CM | POA: Diagnosis present

## 2021-04-27 DIAGNOSIS — N390 Urinary tract infection, site not specified: Principal | ICD-10-CM | POA: Diagnosis present

## 2021-04-27 DIAGNOSIS — Z87891 Personal history of nicotine dependence: Secondary | ICD-10-CM | POA: Diagnosis not present

## 2021-04-27 DIAGNOSIS — G9341 Metabolic encephalopathy: Secondary | ICD-10-CM | POA: Diagnosis present

## 2021-04-27 DIAGNOSIS — Z79899 Other long term (current) drug therapy: Secondary | ICD-10-CM

## 2021-04-27 DIAGNOSIS — E119 Type 2 diabetes mellitus without complications: Secondary | ICD-10-CM | POA: Diagnosis present

## 2021-04-27 DIAGNOSIS — Z20822 Contact with and (suspected) exposure to covid-19: Secondary | ICD-10-CM | POA: Diagnosis present

## 2021-04-27 DIAGNOSIS — F0391 Unspecified dementia with behavioral disturbance: Secondary | ICD-10-CM | POA: Diagnosis present

## 2021-04-27 DIAGNOSIS — E1169 Type 2 diabetes mellitus with other specified complication: Secondary | ICD-10-CM

## 2021-04-27 DIAGNOSIS — Z8673 Personal history of transient ischemic attack (TIA), and cerebral infarction without residual deficits: Secondary | ICD-10-CM

## 2021-04-27 DIAGNOSIS — R319 Hematuria, unspecified: Secondary | ICD-10-CM | POA: Diagnosis present

## 2021-04-27 DIAGNOSIS — Z91030 Bee allergy status: Secondary | ICD-10-CM | POA: Diagnosis not present

## 2021-04-27 DIAGNOSIS — N3001 Acute cystitis with hematuria: Secondary | ICD-10-CM | POA: Diagnosis present

## 2021-04-27 DIAGNOSIS — R4182 Altered mental status, unspecified: Secondary | ICD-10-CM

## 2021-04-27 DIAGNOSIS — I6381 Other cerebral infarction due to occlusion or stenosis of small artery: Secondary | ICD-10-CM | POA: Diagnosis present

## 2021-04-27 DIAGNOSIS — F03918 Unspecified dementia, unspecified severity, with other behavioral disturbance: Secondary | ICD-10-CM | POA: Diagnosis present

## 2021-04-27 DIAGNOSIS — E876 Hypokalemia: Secondary | ICD-10-CM | POA: Diagnosis present

## 2021-04-27 DIAGNOSIS — I10 Essential (primary) hypertension: Secondary | ICD-10-CM | POA: Diagnosis present

## 2021-04-27 LAB — URINALYSIS, ROUTINE W REFLEX MICROSCOPIC
Bilirubin Urine: NEGATIVE
Glucose, UA: NEGATIVE mg/dL
Ketones, ur: NEGATIVE mg/dL
Nitrite: NEGATIVE
Protein, ur: 30 mg/dL — AB
RBC / HPF: >50 RBC/hpf — ABNORMAL HIGH (ref 0–5)
Specific Gravity, Urine: 1.016 (ref 1.005–1.030)
pH: 6 (ref 5.0–8.0)

## 2021-04-27 LAB — COMPREHENSIVE METABOLIC PANEL
ALT: 13 U/L (ref 0–44)
AST: 19 U/L (ref 15–41)
Albumin: 3.9 g/dL (ref 3.5–5.0)
Alkaline Phosphatase: 91 U/L (ref 38–126)
Anion gap: 9 (ref 5–15)
BUN: 16 mg/dL (ref 8–23)
CO2: 25 mmol/L (ref 22–32)
Calcium: 9.2 mg/dL (ref 8.9–10.3)
Chloride: 107 mmol/L (ref 98–111)
Creatinine, Ser: 1 mg/dL (ref 0.61–1.24)
GFR, Estimated: >60 mL/min (ref >60)
Glucose, Bld: 92 mg/dL (ref 70–99)
Potassium: 3.2 mmol/L — ABNORMAL LOW (ref 3.5–5.1)
Sodium: 141 mmol/L (ref 135–145)
Total Bilirubin: 0.5 mg/dL (ref 0.3–1.2)
Total Protein: 7.5 g/dL (ref 6.5–8.1)

## 2021-04-27 LAB — CBG MONITORING, ED: Glucose-Capillary: 106 mg/dL — ABNORMAL HIGH (ref 70–99)

## 2021-04-27 LAB — CBC
HCT: 47.6 % (ref 39.0–52.0)
Hemoglobin: 15.4 g/dL (ref 13.0–17.0)
MCH: 29.7 pg (ref 26.0–34.0)
MCHC: 32.4 g/dL (ref 30.0–36.0)
MCV: 91.9 fL (ref 80.0–100.0)
Platelets: 313 10*3/uL (ref 150–400)
RBC: 5.18 MIL/uL (ref 4.22–5.81)
RDW: 14.5 % (ref 11.5–15.5)
WBC: 9.5 10*3/uL (ref 4.0–10.5)
nRBC: 0 % (ref 0.0–0.2)

## 2021-04-27 LAB — RESP PANEL BY RT-PCR (FLU A&B, COVID) ARPGX2
Influenza A by PCR: NEGATIVE
Influenza B by PCR: NEGATIVE
SARS Coronavirus 2 by RT PCR: NEGATIVE

## 2021-04-27 MED ORDER — HYDRALAZINE HCL 20 MG/ML IJ SOLN
10.0000 mg | INTRAMUSCULAR | Status: DC | PRN
Start: 2021-04-27 — End: 2021-04-30
  Administered 2021-04-27: 10 mg via INTRAVENOUS
  Filled 2021-04-27: qty 1

## 2021-04-27 MED ORDER — HYDROCHLOROTHIAZIDE 25 MG PO TABS
25.0000 mg | ORAL_TABLET | Freq: Every morning | ORAL | Status: DC
  Administered 2021-04-28: 25 mg via ORAL
  Filled 2021-04-27: qty 1

## 2021-04-27 MED ORDER — ONDANSETRON HCL 4 MG/2ML IJ SOLN
4.0000 mg | Freq: Four times a day (QID) | INTRAMUSCULAR | Status: DC | PRN
  Administered 2021-04-27 – 2021-04-28 (×2): 4 mg via INTRAVENOUS
  Filled 2021-04-27: qty 2

## 2021-04-27 MED ORDER — POTASSIUM CHLORIDE CRYS ER 20 MEQ PO TBCR
40.0000 meq | EXTENDED_RELEASE_TABLET | Freq: Once | ORAL | Status: AC
  Administered 2021-04-27: 40 meq via ORAL
  Filled 2021-04-27: qty 2

## 2021-04-27 MED ORDER — SODIUM CHLORIDE 0.9 % IV SOLN
1.0000 g | INTRAVENOUS | Status: DC
  Administered 2021-04-28 – 2021-04-29 (×2): 1 g via INTRAVENOUS
  Filled 2021-04-27 (×2): qty 10

## 2021-04-27 MED ORDER — ENOXAPARIN SODIUM 40 MG/0.4ML IJ SOSY
40.0000 mg | PREFILLED_SYRINGE | INTRAMUSCULAR | Status: DC
  Administered 2021-04-28 – 2021-04-30 (×3): 40 mg via SUBCUTANEOUS
  Filled 2021-04-27 (×3): qty 0.4

## 2021-04-27 MED ORDER — SODIUM CHLORIDE 0.9 % IV BOLUS
1000.0000 mL | Freq: Once | INTRAVENOUS | Status: AC
  Administered 2021-04-27: 1000 mL via INTRAVENOUS

## 2021-04-27 MED ORDER — ONDANSETRON HCL 4 MG/2ML IJ SOLN
INTRAMUSCULAR | Status: AC
  Filled 2021-04-27: qty 2

## 2021-04-27 MED ORDER — ENALAPRIL MALEATE 5 MG PO TABS
20.0000 mg | ORAL_TABLET | Freq: Every day | ORAL | Status: DC
  Administered 2021-04-28 – 2021-04-30 (×3): 20 mg via ORAL
  Filled 2021-04-27 (×3): qty 4

## 2021-04-27 MED ORDER — AMLODIPINE BESYLATE 5 MG PO TABS
10.0000 mg | ORAL_TABLET | Freq: Every day | ORAL | Status: DC
  Administered 2021-04-28 – 2021-04-30 (×3): 10 mg via ORAL
  Filled 2021-04-27 (×3): qty 2

## 2021-04-27 MED ORDER — ACETAMINOPHEN 325 MG PO TABS: 650.0000 mg | ORAL_TABLET | Freq: Four times a day (QID) | ORAL | Status: DC | PRN

## 2021-04-27 MED ORDER — ONDANSETRON HCL 4 MG PO TABS: 4.0000 mg | ORAL_TABLET | Freq: Four times a day (QID) | ORAL | Status: DC | PRN

## 2021-04-27 MED ORDER — SODIUM CHLORIDE 0.9 % IV BOLUS: 1000.0000 mL | Freq: Once | INTRAVENOUS | Status: DC

## 2021-04-27 MED ORDER — DONEPEZIL HCL 5 MG PO TABS
10.0000 mg | ORAL_TABLET | Freq: Every day | ORAL | Status: DC
  Administered 2021-04-29: 10 mg via ORAL
  Filled 2021-04-27 (×2): qty 2

## 2021-04-27 MED ORDER — POLYETHYLENE GLYCOL 3350 17 G PO PACK: 17.0000 g | PACK | Freq: Every day | ORAL | Status: DC | PRN

## 2021-04-27 MED ORDER — SODIUM CHLORIDE 0.9 % IV SOLN
1.0000 g | Freq: Once | INTRAVENOUS | Status: AC
  Administered 2021-04-27: 1 g via INTRAVENOUS
  Filled 2021-04-27: qty 10

## 2021-04-27 MED ORDER — POTASSIUM CHLORIDE IN NACL 20-0.9 MEQ/L-% IV SOLN: INTRAVENOUS | Status: AC

## 2021-04-27 MED ORDER — PRAVASTATIN SODIUM 40 MG PO TABS
40.0000 mg | ORAL_TABLET | Freq: Every day | ORAL | Status: DC
  Administered 2021-04-28 – 2021-04-30 (×3): 40 mg via ORAL
  Filled 2021-04-27 (×3): qty 1

## 2021-04-27 MED ORDER — ACETAMINOPHEN 650 MG RE SUPP: 650.0000 mg | Freq: Four times a day (QID) | RECTAL | Status: DC | PRN

## 2021-04-27 NOTE — ED Notes (Signed)
Unable to successfully complete in/out cath.  Patient's prostate hinders cath tube. External urine bag placed on patient and patient's bedding was changed.

## 2021-04-27 NOTE — ED Notes (Signed)
ED TO INPATIENT HANDOFF REPORT  ED Nurse Name and Phone #:   S Name/Age/Gender Seth Neal 65 y.o. male Room/Bed: APA04/APA04  Code Status   Code Status: Not on file  Home/SNF/Other Nursing Home Patient oriented to: self Is this baseline? Yes   Triage Complete: Triage complete  Chief Complaint Acute metabolic encephalopathy [Z36.64]  Triage Note Per nursing facility pt. Has altered mental status. EMS states pt is nonverbal at base line from a previous stroke. EMS states pt was able to ambulate from bed onto stretcher with light assistants. Pt. Is alert. Facility states pt. Is usually up and walking but they found pt sleeping in bed which the facility states is unusual.    Allergies Allergies  Allergen Reactions  . Bee Venom Hypertension    Level of Care/Admitting Diagnosis ED Disposition    ED Disposition  Admit   Condition  --   [place]: [facility] [[id number]]  Level of Care: Telemetry [5]  Covid Evaluation: Asymptomatic Screening Protocol (No Symptoms)  Diagnosis: Acute metabolic encephalopathy [[id number]]  Admitting Physician: Bethena Roys [[id number]]  Attending Physician: Bethena Roys [phone]  Estimated length of stay: past midnight tomorrow  Certification:: I certify this patient will need inpatient services for at least 2 midnights         B Medical/Surgery History Past Medical History:  Diagnosis Date  . Bowel incontinence   . Diabetes mellitus without complication ([place])    No meds and blood sugar is not checked  . Hyperlipidemia   . Hypertension   . Stroke [facility])    memory, speech   . Urine incontinence    Past Surgical History:  Procedure Laterality Date  . COLONOSCOPY  05/17/2012   Procedure: COLONOSCOPY;  Surgeon: Danie Binder, MD;  [facility];  Service: Endoscopy;  Laterality: N/A;  12:30  . RADIOLOGY WITH ANESTHESIA Right 02/19/2018   Procedure: MRI OF RIGHT FOOT WITH AND WITHOUT CONTRAST;   Surgeon: Radiologist, Medication, MD;  Location: [place];  Service: Radiology;  Laterality: Right;     A IV Location/Drains/Wounds Patient Lines/Drains/Airways Status    Active Line/Drains/Airways    Name Placement date Placement time Site Days   Peripheral IV 04/27/21 20 G Right Antecubital 04/27/21  0507  Antecubital  less than 1          Intake/Output Last 24 hours  Intake/Output Summary (Last 24 hours) at 04/27/2021 2304 Last data filed at 04/27/2021 2150 Gross per 24 hour  Intake 1100 ml  Output --  Net 1100 ml    Labs/Imaging Results for orders placed or performed during the hospital encounter of 04/27/21 (from the past 48 hour(s))  CBG monitoring, ED     Status: Abnormal   Collection Time: 04/27/21 12:44 PM  Result Value Ref Range   Glucose-Capillary 106 (H) 70 - 99 mg/dL    Comment: Glucose reference range applies only to samples taken after fasting for at least 8 hours.  Comprehensive metabolic panel     Status: Abnormal   Collection Time: 04/27/21  1:30 PM  Result Value Ref Range   Sodium 141 135 - 145 mmol/L   Potassium 3.2 (L) 3.5 - 5.1 mmol/L   Chloride 107 98 - 111 mmol/L   CO2 25 22 - 32 mmol/L   Glucose, Bld 92 70 - 99 mg/dL    Comment: Glucose reference range applies only to samples taken after fasting for at least 8 hours.   BUN 16 8 -  23 mg/dL   Creatinine, Ser 1.00 0.61 - 1.24 mg/dL   Calcium 9.2 8.9 - 10.3 mg/dL   Total Protein 7.5 6.5 - 8.1 g/dL   Albumin 3.9 3.5 - 5.0 g/dL   AST 19 15 - 41 U/L   ALT 13 0 - 44 U/L   Alkaline Phosphatase 91 38 - 126 U/L   Total Bilirubin 0.5 0.3 - 1.2 mg/dL   GFR, Estimated >60 >60 mL/min    Comment: (NOTE) Calculated using the CKD-EPI Creatinine Equation (2021)    Anion gap 9 5 - 15    Comment: Performed at [facility], [address] [place], [place] [address]  CBC     Status: None   Collection Time: 04/27/21  1:30 PM  Result Value Ref Range   WBC 9.5 4.0 - 10.5 K/uL   RBC 5.18 4.22 - 5.81 MIL/uL    Hemoglobin 15.4 13.0 - 17.0 g/dL   HCT 47.6 39.0 - 52.0 %   MCV 91.9 80.0 - 100.0 fL   MCH 29.7 26.0 - 34.0 pg   MCHC 32.4 30.0 - 36.0 g/dL   RDW 14.5 11.5 - 15.5 %   Platelets 313 150 - 400 K/uL   nRBC 0.0 0.0 - 0.2 %    Comment: Performed at [facility], [address] [place], [place] [address]  Resp Panel by RT-PCR (Flu A&B, Covid) Nasopharyngeal Swab     Status: None   Collection Time: 04/27/21  2:23 PM   Specimen: Nasopharyngeal Swab; Nasopharyngeal(NP) swabs in vial transport medium  Result Value Ref Range   SARS Coronavirus 2 by RT PCR NEGATIVE NEGATIVE    Comment: (NOTE) SARS-CoV-2 target nucleic acids are NOT DETECTED.  The SARS-CoV-2 RNA is generally detectable in upper respiratory specimens during the acute phase of infection. The lowest concentration of SARS-CoV-2 viral copies this assay can detect is 138 copies/mL. A negative result does not preclude SARS-Cov-2 infection and should not be used as the sole basis for treatment or other patient management decisions. A negative result may occur with  improper specimen collection/handling, submission of specimen other than nasopharyngeal swab, presence of viral mutation(s) within the areas targeted by this assay, and inadequate number of viral copies(<138 copies/mL). A negative result must be combined with clinical observations, patient history, and epidemiological information. The expected result is Negative.  Fact Sheet for Patients:  EntrepreneurPulse.com.au  Fact Sheet for Healthcare Providers:  IncredibleEmployment.be  This test is no t yet approved or cleared by the [place] FDA and  has been authorized for detection and/or diagnosis of SARS-CoV-2 by FDA under an Emergency Use Authorization (EUA). This EUA will remain  in effect (meaning this test can be used) for the duration of the COVID-19 declaration under Section 564(b)(1) of the Act, 21 U.S.C.section  360bbb-3(b)(1), unless the authorization is terminated  or revoked sooner.       Influenza A by PCR NEGATIVE NEGATIVE   Influenza B by PCR NEGATIVE NEGATIVE    Comment: (NOTE) The Xpert Xpress SARS-CoV-2/FLU/RSV plus assay is intended as an aid in the diagnosis of influenza from Nasopharyngeal swab specimens and should not be used as a sole basis for treatment. Nasal washings and aspirates are unacceptable for Xpert Xpress SARS-CoV-2/FLU/RSV testing.  Fact Sheet for Patients: EntrepreneurPulse.com.au  Fact Sheet for Healthcare Providers: IncredibleEmployment.be  This test is not yet approved or cleared by the [place] FDA and has been authorized for detection and/or diagnosis of SARS-CoV-2 by FDA under an Emergency Use Authorization (  EUA). This EUA will remain in effect (meaning this test can be used) for the duration of the COVID-19 declaration under Section 564(b)(1) of the Act, 21 U.S.C. section 360bbb-3(b)(1), unless the authorization is terminated or revoked.  Performed at [facility], [address] [place], [place] [address]   Urinalysis, Routine w reflex microscopic Urine, Catheterized     Status: Abnormal   Collection Time: 04/27/21  6:53 PM  Result Value Ref Range   Color, Urine YELLOW YELLOW   APPearance HAZY (A) CLEAR   Specific Gravity, Urine 1.016 1.005 - 1.030   pH 6.0 5.0 - 8.0   Glucose, UA NEGATIVE NEGATIVE mg/dL   Hgb urine dipstick LARGE (A) NEGATIVE   Bilirubin Urine NEGATIVE NEGATIVE   Ketones, ur NEGATIVE NEGATIVE mg/dL   Protein, ur 30 (A) NEGATIVE mg/dL   Nitrite NEGATIVE NEGATIVE   Leukocytes,Ua TRACE (A) NEGATIVE   RBC / HPF >50 (H) 0 - 5 RBC/hpf   WBC, UA 6-10 0 - 5 WBC/hpf   Bacteria, UA RARE (A) NONE SEEN   Squamous Epithelial / LPF 0-5 0 - 5   Mucus PRESENT     Comment: Performed at [facility], [address] [place], [place] [address]   DG Chest 1 View  Result Date: 04/27/2021 CLINICAL  DATA:  Altered mental status. EXAM: CHEST  1 VIEW COMPARISON:  08/21/2016 FINDINGS: Mildly low lung volumes. Suspected subsegmental atelectasis at the right lung base. Atherosclerotic calcification of the aortic arch. The patient is rotated to the right on today's radiograph, reducing diagnostic sensitivity and specificity. No blunting of the costophrenic angles. No acute bony findings are identified. IMPRESSION: 1. Subsegmental atelectasis or scarring along the right lung base. 2.  Aortic Atherosclerosis (ICD10-I70.0). Electronically Signed   By: Van Clines M.D.   On: 04/27/2021 14:21   CT Head Wo Contrast  Result Date: 04/27/2021 CLINICAL DATA:  Mental status changes. EXAM: CT HEAD WITHOUT CONTRAST TECHNIQUE: Contiguous axial images were obtained from the base of the skull through the vertex without intravenous contrast. COMPARISON:  04/16/2014 FINDINGS: Brain: Small remote right inferior cerebellar infarct, unchanged. Unchanged lacunar infarcts involving the right basal ganglia and left external capsule. Periventricular white matter and corona radiata hypodensities favor chronic ischemic microvascular white matter disease. Otherwise, the brainstem, cerebellum, cerebral peduncles, thalamus, basal ganglia, basilar cisterns, and ventricular system appear within normal limits. No intracranial hemorrhage, mass lesion, or acute CVA. Vascular: There is atherosclerotic calcification of the cavernous carotid arteries bilaterally. Skull: Unremarkable Sinuses/Orbits: Old medial orbital wall fractures. Chronic bilateral maxillary and ethmoid sinusitis. Other: No supplemental non-categorized findings. IMPRESSION: 1. No acute intracranial findings. 2. Remote small infarcts in the right cerebellum, right basal ganglia, and left external capsule. Periventricular white matter and corona radiata hypodensities favor chronic ischemic microvascular white matter disease. 3. Chronic bilateral maxillary and ethmoid  sinusitis. Electronically Signed   By: Van Clines M.D.   On: 04/27/2021 14:19    Pending Labs Unresulted Labs (From admission, onward)    Start     Ordered   04/27/21 2009  Urine Culture  Once,   STAT       Question:  Indication  Answer:  Dysuria   04/27/21 2009   Signed and Held  HIV Antibody (routine testing w rflx)  (HIV Antibody (Routine testing w reflex) panel)  Once,   R        Signed and Held   Signed and Held  Hemoglobin A1c  Once,   R  Signed and Held   Signed and Held  Basic metabolic panel  Tomorrow morning,   R        Signed and Held   Signed and Held  CBC  Tomorrow morning,   R        Signed and Held   Signed and Held  Magnesium  Once,   R        Signed and Held          Vitals/Pain Today's Vitals   04/27/21 1800 04/27/21 1937 04/27/21 2200 04/27/21 2230  BP: (!) 162/139 (!) 186/113 (!) 199/109 (!) 156/90  Pulse: 93 65    Resp: (!) 24 20    Temp:      TempSrc:      SpO2: 100% 99%    Weight:      Height:      PainSc:        Isolation Precautions No active isolations  Medications Medications  hydrALAZINE (APRESOLINE) injection 10 mg (10 mg Intravenous Given 04/27/21 2202)  potassium chloride SA (KLOR-CON) CR tablet 40 mEq (40 mEq Oral Given 04/27/21 1547)  sodium chloride 0.9 % bolus 1,000 mL (0 mLs Intravenous Stopped 04/27/21 1939)  cefTRIAXone (ROCEPHIN) 1 g in sodium chloride 0.9 % 100 mL IVPB (0 g Intravenous Stopped 04/27/21 2150)    Mobility walks Moderate fall risk   Focused Assessments    R Recommendations: See Admitting Provider Note  Report given to:   Additional Notes:

## 2021-04-27 NOTE — ED Notes (Signed)
Patient transported to CT 

## 2021-04-27 NOTE — ED Provider Notes (Signed)
[facility] EMERGENCY DEPARTMENT Provider Note   CSN: [id number] Arrival date & time: 04/27/21  1233     History Chief Complaint  Patient presents with   Altered Mental Status    Seth Neal is a 65 y.o. male.  HPI   65 y/o male with a h/o bowel incontinence, DM, HLD, HTN, CVA, dementia, urinary incontinence who presents to the ED today for eval of AMS.   Pt is nonverbal at baseline so hx Is limited and there is a level 5 caveat for this.   1:31 PM Spoke with Doroteo Bradford from [facility]. She states that at baseline the patient usually is walking the facility from morning until night however he has not gotten out of bed for two days and is not acting himself.  She is not aware of any VS abnormalities or any vomiting, diarrhea or cough. Pt nonverbal at baseline. He can sometimes answer with yes/no but sometimes he does not answer correctly.    Past Medical History:  Diagnosis Date   Bowel incontinence    Diabetes mellitus without complication ([place])    No meds and blood sugar is not checked   Hyperlipidemia    Hypertension    Stroke [facility])    memory, speech    Urine incontinence     Patient Active Problem List   Diagnosis Date Noted   Diabetes ([place]) 12/04/2015   Impaired fasting glucose 05/31/2015   Hyperlipidemia LDL goal <70 05/09/2014   Essential hypertension, benign 07/09/2013   Incontinence 07/09/2013   Multiple lacunar infarcts ([place]) 07/09/2013   Dementia with behavioral disturbance ([place]) 07/09/2013   Prostate hypertrophy 07/09/2013   Obstructive sleep apnea 07/09/2013   Change in bowel habits 05/09/2012    Past Surgical History:  Procedure Laterality Date   COLONOSCOPY  05/17/2012   Procedure: COLONOSCOPY;  Surgeon: Danie Binder, MD;  [facility];  Service: Endoscopy;  Laterality: N/A;  12:30   RADIOLOGY WITH ANESTHESIA Right 02/19/2018   Procedure: MRI OF RIGHT FOOT WITH AND WITHOUT CONTRAST;  Surgeon: Radiologist, Medication, MD;  Location: [place];  Service: Radiology;  Laterality: Right;       Family History  Problem Relation Age of Onset   Colon cancer Neg Hx     Social History   Tobacco Use   Smoking status: Former    Packs/day: 1.00    Pack years: 0.00    Types: Cigarettes   Smokeless tobacco: Never   Tobacco comments:    quit 2017 ish   Vaping Use   Vaping Use: Never used  Substance Use Topics   Alcohol use: Not Currently    Comment: occ   Drug use: No    Home Medications Prior to Admission medications   Medication Sig Start Date End Date Taking? Authorizing Provider  amLODipine (NORVASC) 10 MG tablet Take 10 mg by mouth daily. 02/23/21  Yes [provider]  donepezil (ARICEPT) 10 MG tablet Take 10 mg by mouth at bedtime.   Yes [provider]  enalapril (VASOTEC) 20 MG tablet Take 1 tablet (20 mg total) by mouth daily. 07/20/16  Yes [name], MD  hydrochlorothiazide (HYDRODIURIL) 25 MG tablet Take 1 tablet (25 mg total) by mouth every morning. 07/20/16  Yes [name], MD  pravastatin (PRAVACHOL) 40 MG tablet Take 40 mg by mouth daily. 12/24/20  Yes [provider]  blood glucose meter kit and supplies KIT Dispense based on patient and insurance preference. Test once every  morning.Please dispense lancets, strips,alcohol pads, and etc. Dx:Ell.9  (FOR ICD-9 250.00, 250.01). 03/30/16   [name], MD  pravastatin (PRAVACHOL) 20 MG tablet Take 1 tablet (20 mg total) by mouth at bedtime. Patient not taking: Reported on 04/27/2021 07/20/16   [name], MD    Allergies    Bee venom  Review of Systems   Review of Systems  Unable to perform ROS: Mental status change   Physical Exam Updated Vital Signs BP (!) 186/113 (BP Location: Left Arm)   Pulse 65   Temp 98.8 F (37.1 C) (Oral)   Resp 20   Ht 5' 6" (1.676 m)   Wt 76.3 kg   SpO2 99%   BMI 27.15 kg/m   Physical Exam Vitals and nursing note reviewed.  Constitutional:      Appearance: He is  well-developed.  HENT:     Head: Normocephalic and atraumatic.  Eyes:     Conjunctiva/sclera: Conjunctivae normal.  Cardiovascular:     Rate and Rhythm: Normal rate and regular rhythm.     Heart sounds: Normal heart sounds. No murmur heard. Pulmonary:     Effort: Pulmonary effort is normal.     Breath sounds: Normal breath sounds.     Comments: Rhonchi and wheezing Abdominal:     General: Bowel sounds are normal.     Palpations: Abdomen is soft.     Tenderness: There is no abdominal tenderness. There is no guarding or rebound.  Musculoskeletal:     Cervical back: Neck supple.  Skin:    General: Skin is warm and dry.  Neurological:     Mental Status: He is alert.     Comments: Alert, nonverbal, intermittent shakes head yes/no. Moving all extremities. Following simple commands    ED Results / Procedures / Treatments   Labs (all labs ordered are listed, but only abnormal results are displayed) Labs Reviewed  COMPREHENSIVE METABOLIC PANEL - Abnormal; Notable for the following components:      Result Value   Potassium 3.2 (*)    All other components within normal limits  URINALYSIS, ROUTINE W REFLEX MICROSCOPIC - Abnormal; Notable for the following components:   APPearance HAZY (*)    Hgb urine dipstick LARGE (*)    Protein, ur 30 (*)    Leukocytes,Ua TRACE (*)    RBC / HPF >50 (*)    Bacteria, UA RARE (*)    All other components within normal limits  CBG MONITORING, ED - Abnormal; Notable for the following components:   Glucose-Capillary 106 (*)    All other components within normal limits  RESP PANEL BY RT-PCR (FLU A&B, COVID) [id number]  URINE CULTURE  CBC  CBG MONITORING, ED    EKG EKG Interpretation  Date/Time:  Wednesday April 27 2021 12:43:34 EDT Ventricular Rate:  53 PR Interval:  177 QRS Duration: 96 QT Interval:  405 QTC Calculation: 381 R Axis:   63 Text Interpretation: Sinus rhythm Non-specific ST-t changes Baseline wander Confirmed by Lajean Saver  [phone]) on 04/27/2021 1:52:46 PM  Radiology DG Chest 1 View  Result Date: 04/27/2021 CLINICAL DATA:  Altered mental status. EXAM: CHEST  1 VIEW COMPARISON:  08/21/2016 FINDINGS: Mildly low lung volumes. Suspected subsegmental atelectasis at the right lung base. Atherosclerotic calcification of the aortic arch. The patient is rotated to the right on today's radiograph, reducing diagnostic sensitivity and specificity. No blunting of the costophrenic angles. No acute bony findings are identified. IMPRESSION: 1. Subsegmental atelectasis or scarring along the  right lung base. 2.  Aortic Atherosclerosis (ICD10-I70.0). Electronically Signed   By: Van Clines M.D.   On: 04/27/2021 14:21   CT Head Wo Contrast  Result Date: 04/27/2021 CLINICAL DATA:  Mental status changes. EXAM: CT HEAD WITHOUT CONTRAST TECHNIQUE: Contiguous axial images were obtained from the base of the skull through the vertex without intravenous contrast. COMPARISON:  04/16/2014 FINDINGS: Brain: Small remote right inferior cerebellar infarct, unchanged. Unchanged lacunar infarcts involving the right basal ganglia and left external capsule. Periventricular white matter and corona radiata hypodensities favor chronic ischemic microvascular white matter disease. Otherwise, the brainstem, cerebellum, cerebral peduncles, thalamus, basal ganglia, basilar cisterns, and ventricular system appear within normal limits. No intracranial hemorrhage, mass lesion, or acute CVA. Vascular: There is atherosclerotic calcification of the cavernous carotid arteries bilaterally. Skull: Unremarkable Sinuses/Orbits: Old medial orbital wall fractures. Chronic bilateral maxillary and ethmoid sinusitis. Other: No supplemental non-categorized findings. IMPRESSION: 1. No acute intracranial findings. 2. Remote small infarcts in the right cerebellum, right basal ganglia, and left external capsule. Periventricular white matter and corona radiata hypodensities favor chronic  ischemic microvascular white matter disease. 3. Chronic bilateral maxillary and ethmoid sinusitis. Electronically Signed   By: Van Clines M.D.   On: 04/27/2021 14:19    Procedures Procedures   8:37 PM Cardiac monitoring reveals NSR, HR 50s (Rate & rhythm), as reviewed and interpreted by me. Cardiac monitoring was ordered due to ams and to monitor patient for dysrhythmia.   Medications Ordered in ED Medications  cefTRIAXone (ROCEPHIN) 1 g in sodium chloride 0.9 % 100 mL IVPB (has no administration in time range)  potassium chloride SA (KLOR-CON) CR tablet 40 mEq (40 mEq Oral Given 04/27/21 1547)  sodium chloride 0.9 % bolus 1,000 mL (0 mLs Intravenous Stopped 04/27/21 1939)    ED Course  I have reviewed the triage vital signs and the nursing notes.  Pertinent labs & imaging results that were available during my care of the patient were reviewed by me and considered in my medical decision making (see chart for details).    MDM Rules/Calculators/A&P                          65 y/o M presenting for eval of AMS  Reviewed/interpreted labs CBC unemarkable CMP with mild hypokalemia, otherwise grossly reassuring  - supplemented in the ED UA consistent with uti. Culture added and pt given ceftriaxone  EKG - Sinus rhythm Non-specific ST-t changes Baseline wander   Reviewed/interpreted imaging CT head - 1. No acute intracranial findings. 2. Remote small infarcts in the right cerebellum, right basal ganglia, and left external capsule. Periventricular white matter and corona radiata hypodensities favor chronic ischemic microvascular white matter disease. 3. Chronic bilateral maxillary and ethmoid sinusitis.  Cxr - 1. Subsegmental atelectasis or scarring along the right lung base. 2.  Aortic Atherosclerosis  65 year old male presents to the emergency department today for evaluation of altered mental status.  He is found to have a urinary tract infection, suspect that this is the  underlying cause of his encephalopathy at this time.  He was started on antibiotics here in the ED.  He does no signs of sepsis.  Will admit for further management.  8:37 PM CONSULT With Dr Denton Brick who accepts patient for admission   Final Clinical Impression(s) / ED Diagnoses Final diagnoses:  Acute cystitis with hematuria    Rx / DC Orders ED Discharge Orders     None  Bishop [place] 04/27/21 2037    Lajean Saver, MD 04/30/21 [id number]

## 2021-04-27 NOTE — H&P (Signed)
History and Physical    Seth Neal ZJI:[id number] DOB: 1955/11/09 DOA: 04/27/2021  PCP: Pcp, No   Patient coming from: [facility]  I have personally briefly reviewed patient's old medical records in [place]  Chief Complaint: AMS  HPI: Seth Neal is a 65 y.o. male with medical history significant for dementia, hypertension, diabetes mellitus, OSA, nonverbal at baseline. Brought to Ed from nursing home with reports of altered mental status.  EDP was able to talk to staff at nursing home.  Per facility, patient is usually up and walking around from morning till night,, but patient had not been out of bed in 2 days which was unusual for him.  Patient at baseline is nonverbal, and can answer yes no questions sometimes.  Patient had no other symptoms to be aware of-no vomiting diarrhea or cough. On my evaluation, patient responds no by shaking his head to pain, dysuria or cough or chest pain.  ED Course: Blood Pressure elevated systolic up to 175 on my evaluation.  Temperature 98.8.  potassium 3.2. Chest x-ray without acute abnormality.  WBC 9.5.  UA with positive trace leukocytes.  Head CT unremarkable.  IV ceftriaxone started.  Hospitalist to admit.  Review of Systems: Review of systems limited by patient being nonverbal, and dementia..  Past Medical History:  Diagnosis Date   Bowel incontinence    Diabetes mellitus without complication ([place])    No meds and blood sugar is not checked   Hyperlipidemia    Hypertension    Stroke [facility])    memory, speech    Urine incontinence     Past Surgical History:  Procedure Laterality Date   COLONOSCOPY  05/17/2012   Procedure: COLONOSCOPY;  Surgeon: Seth Binder, MD;  [facility];  Service: Endoscopy;  Laterality: N/A;  12:30   RADIOLOGY WITH ANESTHESIA Right 02/19/2018   Procedure: MRI OF RIGHT FOOT WITH AND WITHOUT CONTRAST;  Surgeon: Radiologist, Medication, MD;  Location: [place];  Service: Radiology;  Laterality:  Right;     reports that he has quit smoking. His smoking use included cigarettes. He smoked an average of 1.00 packs per day. He has never used smokeless tobacco. He reports previous alcohol use. He reports that he does not use drugs.  Allergies  Allergen Reactions   Bee Venom Hypertension    Family History  Problem Relation Age of Onset   Colon cancer Neg Hx    Prior to Admission medications   Medication Sig Start Date End Date Taking? Authorizing Provider  amLODipine (NORVASC) 10 MG tablet Take 10 mg by mouth daily. 02/23/21  Yes [provider]  donepezil (ARICEPT) 10 MG tablet Take 10 mg by mouth at bedtime.   Yes [provider]  enalapril (VASOTEC) 20 MG tablet Take 1 tablet (20 mg total) by mouth daily. 07/20/16  Yes Seth Kirschner, MD  hydrochlorothiazide (HYDRODIURIL) 25 MG tablet Take 1 tablet (25 mg total) by mouth every morning. 07/20/16  Yes Seth Kirschner, MD  pravastatin (PRAVACHOL) 40 MG tablet Take 40 mg by mouth daily. 12/24/20  Yes [provider]  blood glucose meter kit and supplies KIT Dispense based on patient and insurance preference. Test once every morning.Please dispense lancets, strips,alcohol pads, and etc. Dx:Ell.9  (FOR ICD-9 250.00, 250.01). 03/30/16   Seth Kirschner, MD  pravastatin (PRAVACHOL) 20 MG tablet Take 1 tablet (20 mg total) by mouth at bedtime. Patient not taking: Reported on 04/27/2021 07/20/16   Seth Kirschner,  MD    Physical Exam: Limited patient nonverbal. Vitals:   04/27/21 1730 04/27/21 1751 04/27/21 1800 04/27/21 1937  BP: (!) 154/75 (!) 150/75 (!) 162/139 (!) 186/113  Pulse:  77 93 65  Resp: 12 14 (!) 24 20  Temp:      TempSrc:      SpO2:  98% 100% 99%  Weight:      Height:        Constitutional: NAD, calm, comfortable Vitals:   04/27/21 1730 04/27/21 1751 04/27/21 1800 04/27/21 1937  BP: (!) 154/75 (!) 150/75 (!) 162/139 (!) 186/113  Pulse:  77 93 65  Resp: 12 14 (!) 24 20  Temp:       TempSrc:      SpO2:  98% 100% 99%  Weight:      Height:       Eyes: PERRL, lids and conjunctivae normal ENMT: Mucous membranes are mildy dry  Neck: normal, supple, no masses, no thyromegaly Respiratory: clear to auscultation bilaterally, no wheezing, no crackles. Normal respiratory effort. No accessory muscle use.  Cardiovascular: Regular rate and rhythm, no murmurs / rubs / gallops. No extremity edema. 2+ pedal pulses. Abdomen: no tenderness, no masses palpated. No hepatosplenomegaly. Bowel sounds positive.  Musculoskeletal: no clubbing / cyanosis. No joint deformity upper and lower extremities. Good ROM, no contractures. Normal muscle tone.  Skin: no rashes, lesions, ulcers. No induration Neurologic: No apparent facial asymmetry, nonverbal, moans but shaking his head no or nodding yes.  Moving all extremities spontaneously. Psychiatric: Limited patient nonverbal.  Awake and alert.  Labs on Admission: I have personally reviewed following labs and imaging studies  CBC: Recent Labs  Lab 04/27/21 1330  WBC 9.5  HGB 15.4  HCT 47.6  MCV 91.9  PLT 161   Basic Metabolic Panel: Recent Labs  Lab 04/27/21 1330  NA 141  K 3.2*  CL 107  CO2 25  GLUCOSE 92  BUN 16  CREATININE 1.00  CALCIUM 9.2   Liver Function Tests: Recent Labs  Lab 04/27/21 1330  AST 19  ALT 13  ALKPHOS 91  BILITOT 0.5  PROT 7.5  ALBUMIN 3.9   CBG: Recent Labs  Lab 04/27/21 1244  GLUCAP 106*   Urine analysis:    Component Value Date/Time   COLORURINE YELLOW 04/27/2021 1853   APPEARANCEUR HAZY (A) 04/27/2021 1853   LABSPEC 1.016 04/27/2021 1853   PHURINE 6.0 04/27/2021 1853   GLUCOSEU NEGATIVE 04/27/2021 1853   HGBUR LARGE (A) 04/27/2021 1853   BILIRUBINUR NEGATIVE 04/27/2021 [place] NEGATIVE 04/27/2021 1853   PROTEINUR 30 (A) 04/27/2021 1853   NITRITE NEGATIVE 04/27/2021 1853   LEUKOCYTESUR TRACE (A) 04/27/2021 1853    Radiological Exams on Admission: DG Chest 1  View  Result Date: 04/27/2021 CLINICAL DATA:  Altered mental status. EXAM: CHEST  1 VIEW COMPARISON:  08/21/2016 FINDINGS: Mildly low lung volumes. Suspected subsegmental atelectasis at the right lung base. Atherosclerotic calcification of the aortic arch. The patient is rotated to the right on today's radiograph, reducing diagnostic sensitivity and specificity. No blunting of the costophrenic angles. No acute bony findings are identified. IMPRESSION: 1. Subsegmental atelectasis or scarring along the right lung base. 2.  Aortic Atherosclerosis (ICD10-I70.0). Electronically Signed   By: Van Clines M.D.   On: 04/27/2021 14:21   CT Head Wo Contrast  Result Date: 04/27/2021 CLINICAL DATA:  Mental status changes. EXAM: CT HEAD WITHOUT CONTRAST TECHNIQUE: Contiguous axial images were obtained from the base of the skull through  the vertex without intravenous contrast. COMPARISON:  04/16/2014 FINDINGS: Brain: Small remote right inferior cerebellar infarct, unchanged. Unchanged lacunar infarcts involving the right basal ganglia and left external capsule. Periventricular white matter and corona radiata hypodensities favor chronic ischemic microvascular white matter disease. Otherwise, the brainstem, cerebellum, cerebral peduncles, thalamus, basal ganglia, basilar cisterns, and ventricular system appear within normal limits. No intracranial hemorrhage, mass lesion, or acute CVA. Vascular: There is atherosclerotic calcification of the cavernous carotid arteries bilaterally. Skull: Unremarkable Sinuses/Orbits: Old medial orbital wall fractures. Chronic bilateral maxillary and ethmoid sinusitis. Other: No supplemental non-categorized findings. IMPRESSION: 1. No acute intracranial findings. 2. Remote small infarcts in the right cerebellum, right basal ganglia, and left external capsule. Periventricular white matter and corona radiata hypodensities favor chronic ischemic microvascular white matter disease. 3. Chronic  bilateral maxillary and ethmoid sinusitis. Electronically Signed   By: Van Clines M.D.   On: 04/27/2021 14:19    EKG: Independently reviewed.  Sinus rhythm rate 53, QTc 381.  No significant change from prior.  Assessment/Plan Principal Problem:   Acute metabolic encephalopathy Active Problems:   Essential hypertension, benign   Multiple lacunar infarcts (HCC)   Dementia with behavioral disturbance ([facility])   Prostate hypertrophy   Diabetes ([place])   Acute metabolic encephalopathy-  head CT unremarkable.  UA with positive trace leukocytes.  Rules out for sepsis.  Chest x-ray unremarkable.  WBC 9.5.  No prior urine cultures on file. - F/u urine cultures -Continue IV ceftriaxone - 1 L bolus given, continue N/s + 20 kcl 75cc/hr x 15hrs -Bladder scan unable to pass in and out catheter in ED.  Hypokalemia-potassium 3.2. - Replete  - check magnesium  Hypertension blood pressure elevated up to 202 for my evaluation -As needed hydralazine -Resume home Norvasc, enalapril - resume HCTZ in a.m  Dementia, CVA history-nonverbal at baseline, but able to ambulate -Resume donepezil  Diabetes mellitus  - daily CBG - Hgba1c  DVT prophylaxis: Lovenox Code Status: Full code Family Communication: None at bedside Disposition Plan:  ~ 2 days Consults called: none Admission status: inpt, tele I certify that at the point of admission it is my clinical judgment that the patient will require inpatient hospital care spanning beyond 2 midnights from the point of admission due to high intensity of service, high risk for further deterioration and high frequency of surveillance required. The following factors support the patient status of inpatient:    Bethena Roys MD [facility]  04/27/2021, 10:59 PM

## 2021-04-27 NOTE — ED Notes (Signed)
Monitoring patient's vital signs are difficult due to patient pulls leads, cuff and pulse ox off repeatedly

## 2021-04-27 NOTE — ED Triage Notes (Signed)
Per nursing facility pt. Has altered mental status. EMS states pt is nonverbal at base line from a previous stroke. EMS states pt was able to ambulate from bed onto stretcher with light assistants. Pt. Is alert. Facility states pt. Is usually up and walking but they found pt sleeping in bed which the facility states is unusual.

## 2021-04-28 LAB — CBC
HCT: 43 % (ref 39.0–52.0)
Hemoglobin: 14.1 g/dL (ref 13.0–17.0)
MCH: 29.8 pg (ref 26.0–34.0)
MCHC: 32.8 g/dL (ref 30.0–36.0)
MCV: 90.9 fL (ref 80.0–100.0)
Platelets: 345 10*3/uL (ref 150–400)
RBC: 4.73 MIL/uL (ref 4.22–5.81)
RDW: 14.6 % (ref 11.5–15.5)
WBC: 10.9 10*3/uL — ABNORMAL HIGH (ref 4.0–10.5)
nRBC: 0 % (ref 0.0–0.2)

## 2021-04-28 LAB — GLUCOSE, CAPILLARY
Glucose-Capillary: 110 mg/dL — ABNORMAL HIGH (ref 70–99)
Glucose-Capillary: 124 mg/dL — ABNORMAL HIGH (ref 70–99)
Glucose-Capillary: 121 mg/dL — ABNORMAL HIGH (ref 70–99)
Glucose-Capillary: 125 mg/dL — ABNORMAL HIGH (ref 70–99)

## 2021-04-28 LAB — BASIC METABOLIC PANEL
Anion gap: 7 (ref 5–15)
BUN: 13 mg/dL (ref 8–23)
CO2: 23 mmol/L (ref 22–32)
Calcium: 8.7 mg/dL — ABNORMAL LOW (ref 8.9–10.3)
Chloride: 112 mmol/L — ABNORMAL HIGH (ref 98–111)
Creatinine, Ser: 0.91 mg/dL (ref 0.61–1.24)
GFR, Estimated: >60 mL/min (ref >60)
Glucose, Bld: 101 mg/dL — ABNORMAL HIGH (ref 70–99)
Potassium: 3.7 mmol/L (ref 3.5–5.1)
Sodium: 142 mmol/L (ref 135–145)

## 2021-04-28 LAB — HEMOGLOBIN A1C
Hgb A1c MFr Bld: 6.7 % — ABNORMAL HIGH (ref 4.8–5.6)
Mean Plasma Glucose: 145.59 mg/dL

## 2021-04-28 LAB — MAGNESIUM: Magnesium: 1.8 mg/dL (ref 1.7–2.4)

## 2021-04-28 MED ORDER — INSULIN ASPART 100 UNIT/ML IJ SOLN
0.0000 [IU] | Freq: Every day | INTRAMUSCULAR | Status: DC
Start: 2021-04-28 — End: 2021-04-30

## 2021-04-28 MED ORDER — INSULIN ASPART 100 UNIT/ML IJ SOLN: 0.0000 [IU] | Freq: Three times a day (TID) | INTRAMUSCULAR | Status: DC

## 2021-04-28 MED ORDER — HYDROCHLOROTHIAZIDE 25 MG PO TABS
12.5000 mg | ORAL_TABLET | Freq: Every morning | ORAL | Status: DC
  Administered 2021-04-29 – 2021-04-30 (×2): 12.5 mg via ORAL
  Filled 2021-04-28 (×2): qty 1

## 2021-04-28 NOTE — NC FL2 (Signed)
[place] LEVEL OF CARE SCREENING TOOL     IDENTIFICATION  Patient Name: Seth Neal Birthdate: 1956/03/03 Sex: male Admission Date (Current Location): 04/27/2021  [facility] and [place] Number:  [employer] and Address:  [facility] [address], [place]      Provider Number: [id number]  Attending Physician Name and Address:  Roxan Hockey, MD  Relative Name and Phone Number:       Current Level of Care: Hospital Recommended Level of Care: Nursing Facility Prior Approval Number:    Date Approved/Denied:   PASRR Number:    Discharge Plan: SNF    Current Diagnoses: Patient Active Problem List   Diagnosis Date Noted   Acute metabolic encephalopathy 05/39/7673   Diabetes ([place]) 12/04/2015   Impaired fasting glucose 05/31/2015   Hyperlipidemia LDL goal <70 05/09/2014   Essential hypertension, benign 07/09/2013   Incontinence 07/09/2013   Multiple lacunar infarcts ([place]) 07/09/2013   Dementia with behavioral disturbance ([place]) 07/09/2013   Prostate hypertrophy 07/09/2013   Obstructive sleep apnea 07/09/2013   Change in bowel habits 05/09/2012    Orientation RESPIRATION BLADDER Height & Weight     Self  Normal Incontinent Weight: 168 lb 3.2 oz (76.3 kg) Height:  5\' 6"  (167.6 cm)  BEHAVIORAL SYMPTOMS/MOOD NEUROLOGICAL BOWEL NUTRITION STATUS      Incontinent Diet (see dc summary)  AMBULATORY STATUS COMMUNICATION OF NEEDS Skin   Independent Non-Verbally Normal                       Personal Care Assistance Level of Assistance  Bathing, Feeding, Dressing Bathing Assistance: Maximum assistance Feeding assistance: Independent Dressing Assistance: Limited assistance     Functional Limitations Info  Sight, Hearing, Speech Sight Info: Adequate Hearing Info: Adequate Speech Info: Impaired    SPECIAL CARE FACTORS FREQUENCY                       Contractures Contractures Info: Not present     Additional Factors Info  Code Status, Allergies Code Status Info: Full Allergies Info: Bee Venom           Current Medications (04/28/2021):  This is the current hospital active medication list Current Facility-Administered Medications  Medication Dose Route Frequency Provider Last Rate Last Admin   0.9 % NaCl with KCl 20 mEq/ L  infusion   Intravenous Continuous Emokpae, Ejiroghene E, MD 75 mL/hr at 04/28/21 0001 New Bag at 04/28/21 0001   acetaminophen (TYLENOL) tablet 650 mg  650 mg Oral Q6H PRN Emokpae, Ejiroghene E, MD       Or   acetaminophen (TYLENOL) suppository 650 mg  650 mg Rectal Q6H PRN Emokpae, Ejiroghene E, MD       amLODipine (NORVASC) tablet 10 mg  10 mg Oral Daily Emokpae, Ejiroghene E, MD   10 mg at 04/28/21 4193   cefTRIAXone (ROCEPHIN) 1 g in sodium chloride 0.9 % 100 mL IVPB  1 g Intravenous Q24H Emokpae, Ejiroghene E, MD       donepezil (ARICEPT) tablet 10 mg  10 mg Oral QHS Emokpae, Ejiroghene E, MD       enalapril (VASOTEC) tablet 20 mg  20 mg Oral Daily Emokpae, Ejiroghene E, MD   20 mg at 04/28/21 0821   enoxaparin (LOVENOX) injection 40 mg  40 mg Subcutaneous Q24H Emokpae, Ejiroghene E, MD   40 mg at 04/28/21 0502   hydrALAZINE (APRESOLINE) injection 10 mg  10 mg Intravenous Q4H PRN Emokpae, Ejiroghene E, MD   10 mg at 04/27/21 2202   hydrochlorothiazide (HYDRODIURIL) tablet 25 mg  25 mg Oral q morning Emokpae, Ejiroghene E, MD   25 mg at 04/28/21 0821   ondansetron (ZOFRAN) tablet 4 mg  4 mg Oral Q6H PRN Emokpae, Ejiroghene E, MD       Or   ondansetron (ZOFRAN) injection 4 mg  4 mg Intravenous Q6H PRN Emokpae, Ejiroghene E, MD   4 mg at 04/28/21 0839   polyethylene glycol (MIRALAX / GLYCOLAX) packet 17 g  17 g Oral Daily PRN Emokpae, Ejiroghene E, MD       pravastatin (PRAVACHOL) tablet 40 mg  40 mg Oral Daily Emokpae, Ejiroghene E, MD   40 mg at 04/28/21 0820     Discharge Medications: Please see discharge summary for a list of discharge  medications.  Relevant Imaging Results:  Relevant Lab Results:   Additional Information    Shade Flood, LCSW

## 2021-04-28 NOTE — TOC Initial Note (Signed)
Transition of Care [facility]) - Initial/Assessment Note    Patient Details  Name: Seth Neal MRN: [id number] Date of Birth: March 26, 1956  Transition of Care [facility]) CM/SW Contact:    Shade Flood, LCSW Phone Number: 04/28/2021, 11:53 AM  Clinical Narrative:                  Pt admitted from [facility]. Spoke with Jackelyn Poling at [facility] today to update. Pt can return there at dc.   Expected Discharge Plan: Long Term Nursing Home Barriers to Discharge: Continued Medical Work up   Patient Goals and CMS Choice        Expected Discharge Plan and Services Expected Discharge Plan: [place] In-house Referral: Clinical Social Work     Living arrangements for the past 2 months: [place]                                      Prior Living Arrangements/Services Living arrangements for the past 2 months: [place] Lives with:: Facility Resident Patient language and need for interpreter reviewed:: Yes Do you feel safe going back to the place where you live?: Yes      Need for Family Participation in Patient Care: No (Comment) Care giver support system in place?: Yes (comment)   Criminal Activity/Legal Involvement Pertinent to Current Situation/Hospitalization: No - Comment as needed  Activities of Daily Living      Permission Sought/Granted                  Emotional Assessment       Orientation: : Oriented to Self Alcohol / Substance Use: Not Applicable Psych Involvement: No (comment)  Admission diagnosis:  Acute cystitis with hematuria [J68.11] Acute metabolic encephalopathy [X72.62] AMS (altered mental status) [R41.82] Patient Active Problem List   Diagnosis Date Noted   Acute metabolic encephalopathy 03/55/9741   Diabetes ([place]) 12/04/2015   Impaired fasting glucose 05/31/2015   Hyperlipidemia LDL goal <70 05/09/2014   Essential hypertension, benign 07/09/2013   Incontinence 07/09/2013   Multiple lacunar  infarcts ([place]) 07/09/2013   Dementia with behavioral disturbance ([place]) 07/09/2013   Prostate hypertrophy 07/09/2013   Obstructive sleep apnea 07/09/2013   Change in bowel habits 05/09/2012   PCP:  Pcp, No Pharmacy:   [facility] [place] - [address] [place] [address] Phone: [phone] Fax: [place], [place] [place] [address] Phone: [phone] Fax: [phone]     Social Determinants of Health (SDOH) Interventions    Readmission Risk Interventions No flowsheet data found.

## 2021-04-28 NOTE — Progress Notes (Signed)
Patient Demographics:    Seth Neal, is a 65 y.o. male, DOB - [birth year], YCX:[id number]  Admit date - 04/27/2021   Admitting Physician Ejiroghene Arlyce Dice, MD  Outpatient Primary MD for the patient is Pcp, No  LOS - 1   Chief Complaint  Patient presents with   Altered Mental Status        Subjective:    Seth Neal today has no fevers, no emesis,  No chest pain,   Resting comfortably, not very verbal  Assessment  & Plan :    Principal Problem:   Acute metabolic encephalopathy Active Problems:   Essential hypertension, benign   Multiple lacunar infarcts ([place])   Dementia with behavioral disturbance ([place])   Prostate hypertrophy   Diabetes [facility])  Brief Summary 65 y.o. male with medical history significant for dementia, hypertension, diabetes mellitus, OSA, not very talkative at baseline admitted on 04/27/2021 with acute metabolic encephalopathy presumably due to UTI  A/p  1) acute metabolic encephalopathy--presumably due to UTI -Continue IV Rocephin pending urine culture data -Patient continues to void well with condom catheter  2)DM2-A1c 6.7 reflecting excellent diabetic control PTA Use Novolog/Humalog Sliding scale insulin with Accu-Cheks/Fingersticks as ordered   3)HTN-stable, continue amlodipine 10 mg daily, enalapril 20 mg daily, decrease HCTZ to 12.5 mg, IV hydralazine as needed as ordered  4) dementia--- patient oriented to self otherwise advanced cognitive and memory deficits -Continue Aricept  5)HLD--pravastatin as ordered  Disposition/Need for in-Hospital Stay- patient unable to be discharged at this time due to --- acute metabolic cephalopathy secondary to presumed UTI requiring IV antibiotics pending further culture data  Status is: Inpatient  Remains inpatient appropriate because: Please see disposition above  Disposition: The patient is from: SNF               Anticipated d/c is to: SNF              Anticipated d/c date is: 2 days              Patient currently is not medically stable to d/c. Barriers: Not Clinically Stable-   Code Status :  -  Code Status: Full Code   Family Communication:    NA (patient is alert, awake and coherent)   Consults  :  na  DVT Prophylaxis  :   - SCDs  enoxaparin (LOVENOX) injection 40 mg Start: 04/28/21 0600    Lab Results  Component Value Date   PLT 345 04/28/2021    Inpatient Medications  Scheduled Meds:  amLODipine  10 mg Oral Daily   donepezil  10 mg Oral QHS   enalapril  20 mg Oral Daily   enoxaparin (LOVENOX) injection  40 mg Subcutaneous Q24H   hydrochlorothiazide  25 mg Oral q morning   pravastatin  40 mg Oral Daily   Continuous Infusions:  cefTRIAXone (ROCEPHIN)  IV 1 g (04/28/21 1809)   PRN Meds:.acetaminophen **OR** acetaminophen, hydrALAZINE, ondansetron **OR** ondansetron (ZOFRAN) IV, polyethylene glycol    Anti-infectives (From admission, onward)    Start     Dose/Rate Route Frequency Ordered Stop   04/28/21 1900  cefTRIAXone (ROCEPHIN) 1 g in sodium chloride 0.9 % 100 mL IVPB        1 g 200 mL/hr  over 30 Minutes Intravenous Every 24 hours 04/27/21 2349     04/27/21 2015  cefTRIAXone (ROCEPHIN) 1 g in sodium chloride 0.9 % 100 mL IVPB        1 g 200 mL/hr over 30 Minutes Intravenous  Once 04/27/21 2004 04/27/21 2150         Objective:   Vitals:   04/28/21 0007 04/28/21 0621 04/28/21 0821 04/28/21 1358  BP: 130/82 94/74 131/85 129/74  Pulse: (!) 103 89  84  Resp: 20 14  20   Temp: 98.2 F (36.8 C) 98 F (36.7 C)  (!) 97.5 F (36.4 C)  TempSrc: Axillary   Oral  SpO2: 100%   99%  Weight:      Height:        Wt Readings from Last 3 Encounters:  04/27/21 76.3 kg  02/19/18 75.3 kg  08/18/16 81.9 kg     Intake/Output Summary (Last 24 hours) at 04/28/2021 1821 Last data filed at 04/28/2021 1300 Gross per 24 hour  Intake 1450 ml  Output --  Net 1450 ml      Physical Exam  Gen:- Awake Alert, oriented to self ,able to say his name HEENT:- [place].AT, No sclera icterus Neck-Supple Neck,No JVD,.  Lungs-  CTAB , fair symmetrical air movement CV- S1, S2 normal, regular  Abd-  +ve B.Sounds, Abd Soft, No tenderness,    Extremity/Skin:- No  edema, pedal pulses present  Psych-oriented to self ,able to say his name Neuro-generalized weakness, no new focal deficits, no tremors   Data Review:   Micro Results Recent Results (from the past 240 hour(s))  Resp Panel by RT-PCR (Flu A&B, Covid) Nasopharyngeal Swab     Status: None   Collection Time: 04/27/21  2:23 PM   Specimen: Nasopharyngeal Swab; Nasopharyngeal(NP) swabs in vial transport medium  Result Value Ref Range Status   SARS Coronavirus 2 by RT PCR NEGATIVE NEGATIVE Final    Comment: (NOTE) SARS-CoV-2 target nucleic acids are NOT DETECTED.  The SARS-CoV-2 RNA is generally detectable in upper respiratory specimens during the acute phase of infection. The lowest concentration of SARS-CoV-2 viral copies this assay can detect is 138 copies/mL. A negative result does not preclude SARS-Cov-2 infection and should not be used as the sole basis for treatment or other patient management decisions. A negative result may occur with  improper specimen collection/handling, submission of specimen other than nasopharyngeal swab, presence of viral mutation(s) within the areas targeted by this assay, and inadequate number of viral copies(<138 copies/mL). A negative result must be combined with clinical observations, patient history, and epidemiological information. The expected result is Negative.  Fact Sheet for Patients:  EntrepreneurPulse.com.au  Fact Sheet for Healthcare Providers:  IncredibleEmployment.be  This test is no t yet approved or cleared by the [place] FDA and  has been authorized for detection and/or diagnosis of SARS-CoV-2 by FDA under an  Emergency Use Authorization (EUA). This EUA will remain  in effect (meaning this test can be used) for the duration of the COVID-19 declaration under Section 564(b)(1) of the Act, 21 U.S.C.section 360bbb-3(b)(1), unless the authorization is terminated  or revoked sooner.       Influenza A by PCR NEGATIVE NEGATIVE Final   Influenza B by PCR NEGATIVE NEGATIVE Final    Comment: (NOTE) The Xpert Xpress SARS-CoV-2/FLU/RSV plus assay is intended as an aid in the diagnosis of influenza from Nasopharyngeal swab specimens and should not be used as a sole basis for treatment. Nasal washings and aspirates are  unacceptable for Xpert Xpress SARS-CoV-2/FLU/RSV testing.  Fact Sheet for Patients: EntrepreneurPulse.com.au  Fact Sheet for Healthcare Providers: IncredibleEmployment.be  This test is not yet approved or cleared by the [place] FDA and has been authorized for detection and/or diagnosis of SARS-CoV-2 by FDA under an Emergency Use Authorization (EUA). This EUA will remain in effect (meaning this test can be used) for the duration of the COVID-19 declaration under Section 564(b)(1) of the Act, 21 U.S.C. section 360bbb-3(b)(1), unless the authorization is terminated or revoked.  Performed at [facility], [address] [place], [place] [address]     Radiology Reports DG Chest 1 View  Result Date: 04/27/2021 CLINICAL DATA:  Altered mental status. EXAM: CHEST  1 VIEW COMPARISON:  08/21/2016 FINDINGS: Mildly low lung volumes. Suspected subsegmental atelectasis at the right lung base. Atherosclerotic calcification of the aortic arch. The patient is rotated to the right on today's radiograph, reducing diagnostic sensitivity and specificity. No blunting of the costophrenic angles. No acute bony findings are identified. IMPRESSION: 1. Subsegmental atelectasis or scarring along the right lung base. 2.  Aortic Atherosclerosis (ICD10-I70.0). Electronically  Signed   By: Van Clines M.D.   On: 04/27/2021 14:21   CT Head Wo Contrast  Result Date: 04/27/2021 CLINICAL DATA:  Mental status changes. EXAM: CT HEAD WITHOUT CONTRAST TECHNIQUE: Contiguous axial images were obtained from the base of the skull through the vertex without intravenous contrast. COMPARISON:  04/16/2014 FINDINGS: Brain: Small remote right inferior cerebellar infarct, unchanged. Unchanged lacunar infarcts involving the right basal ganglia and left external capsule. Periventricular white matter and corona radiata hypodensities favor chronic ischemic microvascular white matter disease. Otherwise, the brainstem, cerebellum, cerebral peduncles, thalamus, basal ganglia, basilar cisterns, and ventricular system appear within normal limits. No intracranial hemorrhage, mass lesion, or acute CVA. Vascular: There is atherosclerotic calcification of the cavernous carotid arteries bilaterally. Skull: Unremarkable Sinuses/Orbits: Old medial orbital wall fractures. Chronic bilateral maxillary and ethmoid sinusitis. Other: No supplemental non-categorized findings. IMPRESSION: 1. No acute intracranial findings. 2. Remote small infarcts in the right cerebellum, right basal ganglia, and left external capsule. Periventricular white matter and corona radiata hypodensities favor chronic ischemic microvascular white matter disease. 3. Chronic bilateral maxillary and ethmoid sinusitis. Electronically Signed   By: Van Clines M.D.   On: 04/27/2021 14:19     CBC Recent Labs  Lab 04/27/21 1330 04/28/21 1258  WBC 9.5 10.9*  HGB 15.4 14.1  HCT 47.6 43.0  PLT 313 345  MCV 91.9 90.9  MCH 29.7 29.8  MCHC 32.4 32.8  RDW 14.5 14.6    Chemistries  Recent Labs  Lab 04/27/21 1330 04/28/21 0504  NA 141 142  K 3.2* 3.7  CL 107 112*  CO2 25 23  GLUCOSE 92 101*  BUN 16 13  CREATININE 1.00 0.91  CALCIUM 9.2 8.7*  MG  --  1.8  AST 19  --   ALT 13  --   ALKPHOS 91  --   BILITOT 0.5  --     ------------------------------------------------------------------------------------------------------------------ No results for input(s): CHOL, HDL, LDLCALC, TRIG, CHOLHDL, LDLDIRECT in the last 72 hours.  Lab Results  Component Value Date   HGBA1C 6.7 (H) 04/28/2021   ------------------------------------------------------------------------------------------------------------------ No results for input(s): TSH, T4TOTAL, T3FREE, THYROIDAB in the last 72 hours.  Invalid input(s): FREET3 ------------------------------------------------------------------------------------------------------------------ No results for input(s): VITAMINB12, FOLATE, FERRITIN, TIBC, IRON, RETICCTPCT in the last 72 hours.  Coagulation profile No results for input(s): INR, PROTIME in the last 168 hours.  No results for input(s):  DDIMER in the last 72 hours.  Cardiac Enzymes No results for input(s): CKMB, TROPONINI, MYOGLOBIN in the last 168 hours.  Invalid input(s): CK ------------------------------------------------------------------------------------------------------------------ No results found for: BNP   Roxan Hockey M.D on 04/28/2021 at 6:21 PM  Go to [email or web address] - for contact info  [facility] - Office  [phone]

## 2021-04-29 LAB — HIV ANTIBODY (ROUTINE TESTING W REFLEX): HIV Screen 4th Generation wRfx: NONREACTIVE

## 2021-04-29 LAB — URINE CULTURE: Culture: NO GROWTH

## 2021-04-29 LAB — GLUCOSE, CAPILLARY
Glucose-Capillary: 88 mg/dL (ref 70–99)
Glucose-Capillary: 99 mg/dL (ref 70–99)
Glucose-Capillary: 90 mg/dL (ref 70–99)
Glucose-Capillary: 84 mg/dL (ref 70–99)
Glucose-Capillary: 149 mg/dL — ABNORMAL HIGH (ref 70–99)
Glucose-Capillary: 109 mg/dL — ABNORMAL HIGH (ref 70–99)

## 2021-04-29 NOTE — Progress Notes (Signed)
Patient Demographics:    Seth Neal, is a 65 y.o. male, DOB - December 21, 1955, XNT:[id number]  Admit date - 04/27/2021   Admitting Physician Ejiroghene Arlyce Dice, MD  Outpatient Primary MD for the patient is Pcp, No  LOS - 2   Chief Complaint  Patient presents with   Altered Mental Status        Subjective:    Seth Neal today has no fevers, no emesis,  No chest pain,   Oral intake is fair -Resting comfortably not very verbal   Assessment  & Plan :    Principal Problem:   Acute metabolic encephalopathy Active Problems:   Essential hypertension, benign   Multiple lacunar infarcts ([place])   Dementia with behavioral disturbance ([place])   Prostate hypertrophy   Diabetes [facility])  Brief Summary 65 y.o. male with medical history significant for dementia, hypertension, diabetes mellitus, OSA, not very talkative at baseline admitted on 04/27/2021 with acute metabolic encephalopathy presumably due to UTI  A/p  1) acute metabolic encephalopathy--presumably due to UTI -Continue IV Rocephin pending urine and blood culture data -Patient continues to void well with condom catheter -will probably Stop Rocephin on 04/30/21 if urine and Blood cx are still Negative  2)DM2-A1c 6.7 reflecting excellent diabetic control PTA Use Novolog/Humalog Sliding scale insulin with Accu-Cheks/Fingersticks as ordered   3)HTN-stable, continue amlodipine 10 mg daily, enalapril 20 mg daily, decrease HCTZ to 12.5 mg, IV hydralazine as needed as ordered  4) dementia--- patient oriented to self otherwise advanced cognitive and memory deficits -Continue Aricept  5)HLD--pravastatin as ordered  Disposition/Need for in-Hospital Stay- patient unable to be discharged at this time due to --- acute metabolic cephalopathy secondary to presumed UTI requiring IV antibiotics pending further culture data  Status is: Inpatient  Remains  inpatient appropriate because: Please see disposition above  Disposition: The patient is from: SNF              Anticipated d/c is to: SNF              Anticipated d/c date is: 2 days              Patient currently is not medically stable to d/c. Barriers: Not Clinically Stable-   Code Status :  -  Code Status: Full Code   Family Communication:    NA (patient is alert, awake and coherent)   Consults  :  na  DVT Prophylaxis  :   - SCDs  enoxaparin (LOVENOX) injection 40 mg Start: 04/28/21 0600    Lab Results  Component Value Date   PLT 345 04/28/2021    Inpatient Medications  Scheduled Meds:  amLODipine  10 mg Oral Daily   donepezil  10 mg Oral QHS   enalapril  20 mg Oral Daily   enoxaparin (LOVENOX) injection  40 mg Subcutaneous Q24H   hydrochlorothiazide  12.5 mg Oral q morning   insulin aspart  0-5 Units Subcutaneous QHS   insulin aspart  0-6 Units Subcutaneous TID WC   pravastatin  40 mg Oral Daily   Continuous Infusions:  cefTRIAXone (ROCEPHIN)  IV 1 g (04/28/21 1809)   PRN Meds:.acetaminophen **OR** acetaminophen, hydrALAZINE, ondansetron **OR** ondansetron (ZOFRAN) IV, polyethylene glycol    Anti-infectives (From admission, onward)  Start     Dose/Rate Route Frequency Ordered Stop   04/28/21 1900  cefTRIAXone (ROCEPHIN) 1 g in sodium chloride 0.9 % 100 mL IVPB        1 g 200 mL/hr over 30 Minutes Intravenous Every 24 hours 04/27/21 2349     04/27/21 2015  cefTRIAXone (ROCEPHIN) 1 g in sodium chloride 0.9 % 100 mL IVPB        1 g 200 mL/hr over 30 Minutes Intravenous  Once 04/27/21 2004 04/27/21 2150         Objective:   Vitals:   04/28/21 1358 04/28/21 2103 04/29/21 0408 04/29/21 0832  BP: 129/74 (!) 144/82 (!) 141/87 (!) 167/98  Pulse: 84 70 (!) 50   Resp: 20 15 14    Temp: (!) 97.5 F (36.4 C) 98.3 F (36.8 C) 98.7 F (37.1 C)   TempSrc: Oral Oral    SpO2: 99% 98% 100%   Weight:      Height:        Wt Readings from Last 3 Encounters:   04/27/21 76.3 kg  02/19/18 75.3 kg  08/18/16 81.9 kg     Intake/Output Summary (Last 24 hours) at 04/29/2021 1310 Last data filed at 04/29/2021 0830 Gross per 24 hour  Intake 1420 ml  Output 950 ml  Net 470 ml     Physical Exam  Gen:- Awake Alert, oriented to self ,able to say his name HEENT:- [place].AT, No sclera icterus Neck-Supple Neck,No JVD,.  Lungs-  CTAB , fair symmetrical air movement CV- S1, S2 normal, regular  Abd-  +ve B.Sounds, Abd Soft, No tenderness,    Extremity/Skin:- No  edema, pedal pulses present  Psych-oriented to self ,able to say his name Neuro-generalized weakness, no new focal deficits, no tremors   Data Review:   Micro Results Recent Results (from the past 240 hour(s))  Resp Panel by RT-PCR (Flu A&B, Covid) Nasopharyngeal Swab     Status: None   Collection Time: 04/27/21  2:23 PM   Specimen: Nasopharyngeal Swab; Nasopharyngeal(NP) swabs in vial transport medium  Result Value Ref Range Status   SARS Coronavirus 2 by RT PCR NEGATIVE NEGATIVE Final    Comment: (NOTE) SARS-CoV-2 target nucleic acids are NOT DETECTED.  The SARS-CoV-2 RNA is generally detectable in upper respiratory specimens during the acute phase of infection. The lowest concentration of SARS-CoV-2 viral copies this assay can detect is 138 copies/mL. A negative result does not preclude SARS-Cov-2 infection and should not be used as the sole basis for treatment or other patient management decisions. A negative result may occur with  improper specimen collection/handling, submission of specimen other than nasopharyngeal swab, presence of viral mutation(s) within the areas targeted by this assay, and inadequate number of viral copies(<138 copies/mL). A negative result must be combined with clinical observations, patient history, and epidemiological information. The expected result is Negative.  Fact Sheet for Patients:  EntrepreneurPulse.com.au  Fact Sheet for  Healthcare Providers:  IncredibleEmployment.be  This test is no t yet approved or cleared by the [place] FDA and  has been authorized for detection and/or diagnosis of SARS-CoV-2 by FDA under an Emergency Use Authorization (EUA). This EUA will remain  in effect (meaning this test can be used) for the duration of the COVID-19 declaration under Section 564(b)(1) of the Act, 21 U.S.C.section 360bbb-3(b)(1), unless the authorization is terminated  or revoked sooner.       Influenza A by PCR NEGATIVE NEGATIVE Final   Influenza B by PCR NEGATIVE NEGATIVE  Final    Comment: (NOTE) The Xpert Xpress SARS-CoV-2/FLU/RSV plus assay is intended as an aid in the diagnosis of influenza from Nasopharyngeal swab specimens and should not be used as a sole basis for treatment. Nasal washings and aspirates are unacceptable for Xpert Xpress SARS-CoV-2/FLU/RSV testing.  Fact Sheet for Patients: EntrepreneurPulse.com.au  Fact Sheet for Healthcare Providers: IncredibleEmployment.be  This test is not yet approved or cleared by the [place] FDA and has been authorized for detection and/or diagnosis of SARS-CoV-2 by FDA under an Emergency Use Authorization (EUA). This EUA will remain in effect (meaning this test can be used) for the duration of the COVID-19 declaration under Section 564(b)(1) of the Act, 21 U.S.C. section 360bbb-3(b)(1), unless the authorization is terminated or revoked.  Performed at [facility], [address] [place], [place] [address]   Urine Culture     Status: None   Collection Time: 04/27/21  8:09 PM   Specimen: Urine, Clean Catch  Result Value Ref Range Status   Specimen Description   Final    URINE, CLEAN CATCH Performed at [facility], [address], [place], [place] [address]    Special Requests   Final    NONE Performed at [facility], [address] [place] [address], [place], [place] [address]    Culture   Final     NO GROWTH Performed at [facility], [place] [address], [place], [place] [address]    Report Status 04/29/2021 FINAL  Final    Radiology Reports DG Chest 1 View  Result Date: 04/27/2021 CLINICAL DATA:  Altered mental status. EXAM: CHEST  1 VIEW COMPARISON:  08/21/2016 FINDINGS: Mildly low lung volumes. Suspected subsegmental atelectasis at the right lung base. Atherosclerotic calcification of the aortic arch. The patient is rotated to the right on today's radiograph, reducing diagnostic sensitivity and specificity. No blunting of the costophrenic angles. No acute bony findings are identified. IMPRESSION: 1. Subsegmental atelectasis or scarring along the right lung base. 2.  Aortic Atherosclerosis (ICD10-I70.0). Electronically Signed   By: Van Clines M.D.   On: 04/27/2021 14:21   CT Head Wo Contrast  Result Date: 04/27/2021 CLINICAL DATA:  Mental status changes. EXAM: CT HEAD WITHOUT CONTRAST TECHNIQUE: Contiguous axial images were obtained from the base of the skull through the vertex without intravenous contrast. COMPARISON:  04/16/2014 FINDINGS: Brain: Small remote right inferior cerebellar infarct, unchanged. Unchanged lacunar infarcts involving the right basal ganglia and left external capsule. Periventricular white matter and corona radiata hypodensities favor chronic ischemic microvascular white matter disease. Otherwise, the brainstem, cerebellum, cerebral peduncles, thalamus, basal ganglia, basilar cisterns, and ventricular system appear within normal limits. No intracranial hemorrhage, mass lesion, or acute CVA. Vascular: There is atherosclerotic calcification of the cavernous carotid arteries bilaterally. Skull: Unremarkable Sinuses/Orbits: Old medial orbital wall fractures. Chronic bilateral maxillary and ethmoid sinusitis. Other: No supplemental non-categorized findings. IMPRESSION: 1. No acute intracranial findings. 2. Remote small infarcts in the right cerebellum, right basal  ganglia, and left external capsule. Periventricular white matter and corona radiata hypodensities favor chronic ischemic microvascular white matter disease. 3. Chronic bilateral maxillary and ethmoid sinusitis. Electronically Signed   By: Van Clines M.D.   On: 04/27/2021 14:19     CBC Recent Labs  Lab 04/27/21 1330 04/28/21 1258  WBC 9.5 10.9*  HGB 15.4 14.1  HCT 47.6 43.0  PLT 313 345  MCV 91.9 90.9  MCH 29.7 29.8  MCHC 32.4 32.8  RDW 14.5 14.6    Chemistries  Recent Labs  Lab  04/27/21 1330 04/28/21 0504  NA 141 142  K 3.2* 3.7  CL 107 112*  CO2 25 23  GLUCOSE 92 101*  BUN 16 13  CREATININE 1.00 0.91  CALCIUM 9.2 8.7*  MG  --  1.8  AST 19  --   ALT 13  --   ALKPHOS 91  --   BILITOT 0.5  --    ------------------------------------------------------------------------------------------------------------------ No results for input(s): CHOL, HDL, LDLCALC, TRIG, CHOLHDL, LDLDIRECT in the last 72 hours.  Lab Results  Component Value Date   HGBA1C 6.7 (H) 04/28/2021   ------------------------------------------------------------------------------------------------------------------ No results for input(s): TSH, T4TOTAL, T3FREE, THYROIDAB in the last 72 hours.  Invalid input(s): FREET3 ------------------------------------------------------------------------------------------------------------------ No results for input(s): VITAMINB12, FOLATE, FERRITIN, TIBC, IRON, RETICCTPCT in the last 72 hours.  Coagulation profile No results for input(s): INR, PROTIME in the last 168 hours.  No results for input(s): DDIMER in the last 72 hours.  Cardiac Enzymes No results for input(s): CKMB, TROPONINI, MYOGLOBIN in the last 168 hours.  Invalid input(s): CK ------------------------------------------------------------------------------------------------------------------ No results found for: BNP   Roxan Hockey M.D on 04/29/2021 at 1:10 PM  Go to [email or web address] - for  contact info  [facility] - Office  [phone]

## 2021-04-29 NOTE — Care Management Important Message (Signed)
Important Message  Patient Details  Name: Seth Neal MRN: [id number] Date of Birth: [birth year]   Medicare Important Message Given:  Yes     [name] 04/29/2021, 12:27 PM

## 2021-04-30 LAB — BASIC METABOLIC PANEL
Anion gap: 8 (ref 5–15)
BUN: 12 mg/dL (ref 8–23)
CO2: 25 mmol/L (ref 22–32)
Calcium: 9.2 mg/dL (ref 8.9–10.3)
Chloride: 104 mmol/L (ref 98–111)
Creatinine, Ser: 0.88 mg/dL (ref 0.61–1.24)
GFR, Estimated: >60 mL/min (ref >60)
Glucose, Bld: 110 mg/dL — ABNORMAL HIGH (ref 70–99)
Potassium: 3.4 mmol/L — ABNORMAL LOW (ref 3.5–5.1)
Sodium: 137 mmol/L (ref 135–145)

## 2021-04-30 LAB — GLUCOSE, CAPILLARY
Glucose-Capillary: 90 mg/dL (ref 70–99)
Glucose-Capillary: 132 mg/dL — ABNORMAL HIGH (ref 70–99)

## 2021-04-30 LAB — CBC
HCT: 42.3 % (ref 39.0–52.0)
Hemoglobin: 14.1 g/dL (ref 13.0–17.0)
MCH: 30.1 pg (ref 26.0–34.0)
MCHC: 33.3 g/dL (ref 30.0–36.0)
MCV: 90.4 fL (ref 80.0–100.0)
Platelets: 330 10*3/uL (ref 150–400)
RBC: 4.68 MIL/uL (ref 4.22–5.81)
RDW: 14.1 % (ref 11.5–15.5)
WBC: 10.9 10*3/uL — ABNORMAL HIGH (ref 4.0–10.5)
nRBC: 0 % (ref 0.0–0.2)

## 2021-04-30 MED ORDER — POTASSIUM CHLORIDE ER 10 MEQ PO TBCR: 10.0000 meq | EXTENDED_RELEASE_TABLET | Freq: Every day | ORAL | 2 refills | Status: AC

## 2021-04-30 MED ORDER — HYDROCHLOROTHIAZIDE 12.5 MG PO TABS: 12.5000 mg | ORAL_TABLET | Freq: Every morning | ORAL | 3 refills | Status: DC

## 2021-04-30 MED ORDER — POTASSIUM CHLORIDE CRYS ER 20 MEQ PO TBCR
40.0000 meq | EXTENDED_RELEASE_TABLET | Freq: Once | ORAL | Status: AC
  Administered 2021-04-30: 40 meq via ORAL
  Filled 2021-04-30: qty 2

## 2021-04-30 MED ORDER — ASPIRIN EC 81 MG PO TBEC: 81.0000 mg | DELAYED_RELEASE_TABLET | Freq: Every day | ORAL | 2 refills | Status: AC

## 2021-04-30 MED ORDER — ACETAMINOPHEN 325 MG PO TABS: 650.0000 mg | ORAL_TABLET | Freq: Four times a day (QID) | ORAL | 0 refills | Status: AC | PRN

## 2021-04-30 MED ORDER — POTASSIUM CHLORIDE ER 10 MEQ PO TBCR: 10.0000 meq | EXTENDED_RELEASE_TABLET | Freq: Every day | ORAL | 2 refills | Status: DC

## 2021-04-30 MED ORDER — HYDROCHLOROTHIAZIDE 12.5 MG PO TABS: 12.5000 mg | ORAL_TABLET | Freq: Every morning | ORAL | 3 refills | Status: AC

## 2021-04-30 MED ORDER — ASPIRIN EC 81 MG PO TBEC: 81.0000 mg | DELAYED_RELEASE_TABLET | Freq: Every day | ORAL | 2 refills | Status: DC

## 2021-04-30 MED ORDER — SODIUM CHLORIDE 0.9 % IV SOLN
1.0000 g | Freq: Once | INTRAVENOUS | Status: AC
  Administered 2021-04-30: 1 g via INTRAVENOUS
  Filled 2021-04-30: qty 10

## 2021-04-30 NOTE — Discharge Instructions (Signed)
1) please encourage adequate fluid intake to avoid dehydration--patient needs help with meals from time to time

## 2021-04-30 NOTE — Discharge Summary (Signed)
Seth Neal, is a 65 y.o. male  DOB [birth year]  MRN [id number].  Admission date:  04/27/2021  Admitting Physician  Bethena Roys, MD  Discharge Date:  04/30/2021   Primary MD  Pcp, No  Recommendations for primary care physician for things to follow:   please encourage adequate fluid intake to avoid dehydration--patient needs help with meals from time to time   Admission Diagnosis  Acute cystitis with hematuria [K16.01] Acute metabolic encephalopathy [U93.23] AMS (altered mental status) [R41.82]   Discharge Diagnosis  Acute cystitis with hematuria [F57.32] Acute metabolic encephalopathy [K02.54] AMS (altered mental status) [R41.82]    Principal Problem:   Acute metabolic encephalopathy Active Problems:   Essential hypertension, benign   Multiple lacunar infarcts (HCC)   Dementia with behavioral disturbance (HCC)   Prostate hypertrophy   Diabetes ([place])      Past Medical History:  Diagnosis Date   Bowel incontinence    Diabetes mellitus without complication ([place])    No meds and blood sugar is not checked   Hyperlipidemia    Hypertension    Stroke ([place])    memory, speech    Urine incontinence     Past Surgical History:  Procedure Laterality Date   COLONOSCOPY  05/17/2012   Procedure: COLONOSCOPY;  Surgeon: Danie Binder, MD;  [facility];  Service: Endoscopy;  Laterality: N/A;  12:30   RADIOLOGY WITH ANESTHESIA Right 02/19/2018   Procedure: MRI OF RIGHT FOOT WITH AND WITHOUT CONTRAST;  Surgeon: Radiologist, Medication, MD;  Location: [facility];  Service: Radiology;  Laterality: Right;     HPI  from the history and physical done on the day of admission:     Chief Complaint: AMS   HPI: Seth Neal is a 65 y.o. male with medical history significant for dementia, hypertension, diabetes mellitus, OSA, nonverbal at baseline. Brought to Ed from nursing home with reports of  altered mental status.  EDP was able to talk to staff at nursing home.  Per facility, patient is usually up and walking around from morning till night,, but patient had not been out of bed in 2 days which was unusual for him.  Patient at baseline is nonverbal, and can answer yes no questions sometimes.  Patient had no other symptoms to be aware of-no vomiting diarrhea or cough. On my evaluation, patient responds no by shaking his head to pain, dysuria or cough or chest pain.   ED Course: Blood Pressure elevated systolic up to 270 on my evaluation.  Temperature 98.8.  potassium 3.2. Chest x-ray without acute abnormality.  WBC 9.5.  UA with positive trace leukocytes.  Head CT unremarkable.  IV ceftriaxone started.  Hospitalist to admit.   Review of Systems: Review of systems limited by patient being nonverbal, and dementia.Marland Kitchen     Hospital Course:    Brief Summary 65 y.o. male with medical history significant for dementia, hypertension, diabetes mellitus, OSA, not very talkative at baseline admitted on 04/27/2021 with acute metabolic encephalopathy presumably due to UTI  A/p 1) acute metabolic encephalopathy--presumably due to UTI -Treated with IV Rocephin  -Urine and blood cultures NGTD -Patient continues to void well with condom catheter -Mentation appears improved and back to baseline -No further antibiotics indicated at this time   2)DM2-A1c 6.7 reflecting excellent diabetic control PTA   3)HTN-stable, continue amlodipine 10 mg daily, enalapril 20 mg daily, decrease HCTZ to 12.5 mg,  give KCl 53meq daily supplementation   4) dementia--- patient oriented to self otherwise advanced cognitive and memory deficits -Continue Aricept   5)HLD--pravastatin  6) hypokalemia--replaced please see #3 above   Disposition/--[facility]    Disposition: The patient is from: SNF              Anticipated d/c is to: SNF                Code Status :  -  Code Status: Full Code    Family  Communication:  left voicemail for his  wife Izora Gala --at [phone]  Consults  :  na  Discharge Condition: stable  Follow UP Diet and Activity recommendation:  As advised  Discharge Instructions    Discharge Instructions     Call MD for:  difficulty breathing, headache or visual disturbances   Complete by: As directed    Call MD for:  persistant dizziness or light-headedness   Complete by: As directed    Call MD for:  persistant nausea and vomiting   Complete by: As directed    Call MD for:  severe uncontrolled pain   Complete by: As directed    Call MD for:  temperature >100.4   Complete by: As directed    Diet - low sodium heart healthy   Complete by: As directed    Diet Carb Modified   Complete by: As directed    Discharge instructions   Complete by: As directed    1) please encourage adequate fluid intake to avoid dehydration--patient needs help with meals from time to time   Increase activity slowly   Complete by: As directed        Discharge Medications     Allergies as of 04/30/2021       Reactions   Bee Venom Hypertension        Medication List     TAKE these medications    acetaminophen 325 MG tablet Commonly known as: TYLENOL Take 2 tablets (650 mg total) by mouth every 6 (six) hours as needed for mild pain (or Fever >/= 101).   amLODipine 10 MG tablet Commonly known as: NORVASC Take 10 mg by mouth daily.   aspirin EC 81 MG tablet Take 1 tablet (81 mg total) by mouth daily with breakfast.   blood glucose meter kit and supplies Kit Dispense based on patient and insurance preference. Test once every morning.Please dispense lancets, strips,alcohol pads, and etc. Dx:Ell.9  (FOR ICD-9 250.00, 250.01).   donepezil 10 MG tablet Commonly known as: ARICEPT Take 10 mg by mouth at bedtime.   enalapril 20 MG tablet Commonly known as: VASOTEC Take 1 tablet (20 mg total) by mouth daily.   hydrochlorothiazide 12.5 MG tablet Commonly known as:  HYDRODIURIL Take 1 tablet (12.5 mg total) by mouth every morning. Start taking on: May 01, 2021 What changed:  medication strength how much to take   potassium chloride 10 MEQ tablet Commonly known as: KLOR-CON Take 1 tablet (10 mEq total) by mouth daily. Take While taking HCTZ   pravastatin 40 MG tablet Commonly known as:  PRAVACHOL Take 40 mg by mouth daily. What changed: Another medication with the same name was removed. Continue taking this medication, and follow the directions you see here.        Major procedures and Radiology Reports - PLEASE review detailed and final reports for all details, in brief -   DG Chest 1 View  Result Date: 04/27/2021 CLINICAL DATA:  Altered mental status. EXAM: CHEST  1 VIEW COMPARISON:  08/21/2016 FINDINGS: Mildly low lung volumes. Suspected subsegmental atelectasis at the right lung base. Atherosclerotic calcification of the aortic arch. The patient is rotated to the right on today's radiograph, reducing diagnostic sensitivity and specificity. No blunting of the costophrenic angles. No acute bony findings are identified. IMPRESSION: 1. Subsegmental atelectasis or scarring along the right lung base. 2.  Aortic Atherosclerosis (ICD10-I70.0). Electronically Signed   By: Van Clines M.D.   On: 04/27/2021 14:21   CT Head Wo Contrast  Result Date: 04/27/2021 CLINICAL DATA:  Mental status changes. EXAM: CT HEAD WITHOUT CONTRAST TECHNIQUE: Contiguous axial images were obtained from the base of the skull through the vertex without intravenous contrast. COMPARISON:  04/16/2014 FINDINGS: Brain: Small remote right inferior cerebellar infarct, unchanged. Unchanged lacunar infarcts involving the right basal ganglia and left external capsule. Periventricular white matter and corona radiata hypodensities favor chronic ischemic microvascular white matter disease. Otherwise, the brainstem, cerebellum, cerebral peduncles, thalamus, basal ganglia, basilar  cisterns, and ventricular system appear within normal limits. No intracranial hemorrhage, mass lesion, or acute CVA. Vascular: There is atherosclerotic calcification of the cavernous carotid arteries bilaterally. Skull: Unremarkable Sinuses/Orbits: Old medial orbital wall fractures. Chronic bilateral maxillary and ethmoid sinusitis. Other: No supplemental non-categorized findings. IMPRESSION: 1. No acute intracranial findings. 2. Remote small infarcts in the right cerebellum, right basal ganglia, and left external capsule. Periventricular white matter and corona radiata hypodensities favor chronic ischemic microvascular white matter disease. 3. Chronic bilateral maxillary and ethmoid sinusitis. Electronically Signed   By: Van Clines M.D.   On: 04/27/2021 14:19    Micro Results   Recent Results (from the past 240 hour(s))  Resp Panel by RT-PCR (Flu A&B, Covid) Nasopharyngeal Swab     Status: None   Collection Time: 04/27/21  2:23 PM   Specimen: Nasopharyngeal Swab; Nasopharyngeal(NP) swabs in vial transport medium  Result Value Ref Range Status   SARS Coronavirus 2 by RT PCR NEGATIVE NEGATIVE Final    Comment: (NOTE) SARS-CoV-2 target nucleic acids are NOT DETECTED.  The SARS-CoV-2 RNA is generally detectable in upper respiratory specimens during the acute phase of infection. The lowest concentration of SARS-CoV-2 viral copies this assay can detect is 138 copies/mL. A negative result does not preclude SARS-Cov-2 infection and should not be used as the sole basis for treatment or other patient management decisions. A negative result may occur with  improper specimen collection/handling, submission of specimen other than nasopharyngeal swab, presence of viral mutation(s) within the areas targeted by this assay, and inadequate number of viral copies(<138 copies/mL). A negative result must be combined with clinical observations, patient history, and epidemiological information. The  expected result is Negative.  Fact Sheet for Patients:  EntrepreneurPulse.com.au  Fact Sheet for Healthcare Providers:  IncredibleEmployment.be  This test is no t yet approved or cleared by the [place] FDA and  has been authorized for detection and/or diagnosis of SARS-CoV-2 by FDA under an Emergency Use Authorization (EUA). This EUA will remain  in effect (meaning this test can be used) for the duration of the COVID-19 declaration  under Section 564(b)(1) of the Act, 21 U.S.C.section 360bbb-3(b)(1), unless the authorization is terminated  or revoked sooner.       Influenza A by PCR NEGATIVE NEGATIVE Final   Influenza B by PCR NEGATIVE NEGATIVE Final    Comment: (NOTE) The Xpert Xpress SARS-CoV-2/FLU/RSV plus assay is intended as an aid in the diagnosis of influenza from Nasopharyngeal swab specimens and should not be used as a sole basis for treatment. Nasal washings and aspirates are unacceptable for Xpert Xpress SARS-CoV-2/FLU/RSV testing.  Fact Sheet for Patients: EntrepreneurPulse.com.au  Fact Sheet for Healthcare Providers: IncredibleEmployment.be  This test is not yet approved or cleared by the [place] FDA and has been authorized for detection and/or diagnosis of SARS-CoV-2 by FDA under an Emergency Use Authorization (EUA). This EUA will remain in effect (meaning this test can be used) for the duration of the COVID-19 declaration under Section 564(b)(1) of the Act, 21 U.S.C. section 360bbb-3(b)(1), unless the authorization is terminated or revoked.  Performed at [facility], [address], [place], [place] [address]   Urine Culture     Status: None   Collection Time: 04/27/21  8:09 PM   Specimen: Urine, Clean Catch  Result Value Ref Range Status   Specimen Description   Final    URINE, CLEAN CATCH Performed at [facility], [address] [place], [place] [address]    Special  Requests   Final    NONE Performed at [facility], [address], [place], [place] [address]    Culture   Final    NO GROWTH Performed at [facility], [place] [address] [place], [place] [address]    Report Status 04/29/2021 FINAL  Final  Culture, blood (Routine X 2) w Reflex to ID Panel     Status: None (Preliminary result)   Collection Time: 04/29/21  8:54 AM   Specimen: BLOOD LEFT ARM  Result Value Ref Range Status   Specimen Description BLOOD LEFT ARM BOTTLES DRAWN AEROBIC AND ANAEROBIC  Final   Special Requests   Final    Blood Culture adequate volume Performed at [facility], [address], [place], [place] [address]    Culture PENDING  Incomplete   Report Status PENDING  Incomplete  Culture, blood (Routine X 2) w Reflex to ID Panel     Status: None (Preliminary result)   Collection Time: 04/29/21  8:54 AM   Specimen: BLOOD LEFT HAND  Result Value Ref Range Status   Specimen Description BLOOD LEFT HAND BOTTLES DRAWN AEROBIC ONLY  Final   Special Requests   Final    Blood Culture adequate volume Performed at [facility], [address], [place], [place] [address]    Culture PENDING  Incomplete   Report Status PENDING  Incomplete       Today   Subjective    Seth Neal today has no new complaints, -Oral intake is fair, -Continues to void well, no fevers, no vomiting no diarrhea      -Awake Alert, oriented to self ,able to say his name, feeding himself    Patient has been seen and examined prior to discharge   Objective   Blood pressure (!) 168/100, pulse 92, temperature (!) 97.4 F (36.3 C), temperature source Oral, resp. rate 18, height $RemoveBe'5\' 6"'ePzGGWNrh$  (1.676 m), weight 76.3 kg, SpO2 100 %.  Temp:  [97.4 F (36.3 C)-98 F (36.7 C)] 97.4 F (36.3 C) (07/16 0435) Pulse Rate:  [81-100] 92 (07/16 0435) Resp:  [16-18] 18 (07/16  0435) BP: (145-168)/(85-100) 168/100 (07/16 0435) SpO2:  [94 %-100 %] 100 % (07/15 1931)   Intake/Output Summary (Last 24 hours) at  04/30/2021 1140 Last data filed at 04/30/2021 1008 Gross per 24 hour  Intake 1240.67 ml  Output --  Net 1240.67 ml    Exam Gen:- Awake Alert, oriented to self ,able to say his name, feeding himself HEENT:- [place].AT, No sclera icterus Neck-Supple Neck,No JVD,. Lungs-  CTAB , fair symmetrical air movement CV- S1, S2 normal, regular Abd-  +ve B.Sounds, Abd Soft, No tenderness, no CVA tenderness    Extremity/Skin:- No  edema, pedal pulses present Psych-oriented to self ,able to say his name Neuro-generalized weakness, no new focal deficits, no tremors   Data Review   CBC w Diff:  Lab Results  Component Value Date   WBC 10.9 (H) 04/30/2021   HGB 14.1 04/30/2021   HCT 42.3 04/30/2021   PLT 330 04/30/2021   LYMPHOPCT 11 (L) 04/16/2014   MONOPCT 3 04/16/2014   EOSPCT 0 04/16/2014   BASOPCT 0 04/16/2014    CMP:  Lab Results  Component Value Date   NA 137 04/30/2021   NA 140 05/01/2015   K 3.4 (L) 04/30/2021   K 4.1 04/29/2012   CL 104 04/30/2021   CL 105 04/29/2012   CO2 25 04/30/2021   CO2 30 04/29/2012   BUN 12 04/30/2021   BUN 12 05/01/2015   CREATININE 0.88 04/30/2021   CREATININE 1.23 04/04/2014   PROT 7.5 04/27/2021   PROT 7.1 11/13/2015   PROT 6.9 04/29/2012   ALBUMIN 3.9 04/27/2021   ALBUMIN 4.2 11/13/2015   BILITOT 0.5 04/27/2021   BILITOT 0.3 11/13/2015   BILITOT 0.5 04/29/2012   ALKPHOS 91 04/27/2021   ALKPHOS 86 04/29/2012   AST 19 04/27/2021   AST 17 04/29/2012   ALT 13 04/27/2021  .   Total Discharge time is about 33 minutes  Roxan Hockey M.D on 04/30/2021 at 11:40 AM  Go to [email or web address] -  for contact info  [facility] - Office  [phone]

## 2021-04-30 NOTE — TOC Transition Note (Signed)
Transition of Care [facility]) - CM/SW Discharge Note   Patient Details  Name: Seth Neal MRN: [id number] Date of Birth: Feb 23, 1956  [facility] CM/SW Contact:  Natasha Bence, LCSW Phone Number: 04/30/2021, 12:20 PM   Clinical Narrative:    CSW notified of patient's readiness for discharge. Debbie with [facility] agreeable to take patient back on 09/20/21. CSW completed med necessity and called EMS. Nurse to call report. TOC signing off.   Final next level of care: Long Term Acute Care (LTAC) Barriers to Discharge: Barriers Resolved   Patient Goals and CMS Choice Patient states their goals for this hospitalization and ongoing recovery are:: Return to LTC CMS Medicare.gov Compare Post Acute Care list provided to:: Patient Choice offered to / list presented to : Patient  Discharge Placement                Patient to be transferred to facility by: [facility] Name of family member notified: Can,[name] (Spouse)   [phone] Patient and family notified of of transfer: 04/30/21  Discharge Plan and Services In-house Referral: Clinical Social Work                                   Social Determinants of Health ([place]) Interventions     Readmission Risk Interventions No flowsheet data found.

## 2021-05-04 LAB — CULTURE, BLOOD (ROUTINE X 2)
Culture: NO GROWTH
Culture: NO GROWTH
Special Requests: ADEQUATE
Special Requests: ADEQUATE

## 2021-05-04 LAB — GLUCOSE, CAPILLARY
Glucose-Capillary: 122 mg/dL — ABNORMAL HIGH (ref 70–99)
Glucose-Capillary: 99 mg/dL (ref 70–99)

## 2021-06-19 ENCOUNTER — Emergency Department (HOSPITAL_COMMUNITY): Payer: Medicare Other

## 2021-06-19 ENCOUNTER — Other Ambulatory Visit: Payer: Self-pay

## 2021-06-19 ENCOUNTER — Observation Stay (HOSPITAL_COMMUNITY)
Admission: EM | Admit: 2021-06-19 | Discharge: 2021-06-20 | Disposition: A | Discharge status: Home / Self Care | Payer: Medicare Other | Attending: Family Medicine | Admitting: Family Medicine

## 2021-06-19 DIAGNOSIS — E119 Type 2 diabetes mellitus without complications: Secondary | ICD-10-CM | POA: Diagnosis not present

## 2021-06-19 DIAGNOSIS — Z87891 Personal history of nicotine dependence: Secondary | ICD-10-CM | POA: Insufficient documentation

## 2021-06-19 DIAGNOSIS — F0151 Vascular dementia with behavioral disturbance: Secondary | ICD-10-CM

## 2021-06-19 DIAGNOSIS — E785 Hyperlipidemia, unspecified: Secondary | ICD-10-CM | POA: Diagnosis present

## 2021-06-19 DIAGNOSIS — R55 Syncope and collapse: Principal | ICD-10-CM | POA: Diagnosis present

## 2021-06-19 DIAGNOSIS — F0391 Unspecified dementia with behavioral disturbance: Secondary | ICD-10-CM | POA: Diagnosis not present

## 2021-06-19 DIAGNOSIS — E876 Hypokalemia: Secondary | ICD-10-CM | POA: Insufficient documentation

## 2021-06-19 DIAGNOSIS — N400 Enlarged prostate without lower urinary tract symptoms: Secondary | ICD-10-CM | POA: Diagnosis present

## 2021-06-19 DIAGNOSIS — Z7982 Long term (current) use of aspirin: Secondary | ICD-10-CM | POA: Diagnosis not present

## 2021-06-19 DIAGNOSIS — Z79899 Other long term (current) drug therapy: Secondary | ICD-10-CM | POA: Insufficient documentation

## 2021-06-19 DIAGNOSIS — I10 Essential (primary) hypertension: Secondary | ICD-10-CM | POA: Diagnosis not present

## 2021-06-19 DIAGNOSIS — G4733 Obstructive sleep apnea (adult) (pediatric): Secondary | ICD-10-CM | POA: Diagnosis present

## 2021-06-19 DIAGNOSIS — Z20822 Contact with and (suspected) exposure to covid-19: Secondary | ICD-10-CM | POA: Insufficient documentation

## 2021-06-19 DIAGNOSIS — I6381 Other cerebral infarction due to occlusion or stenosis of small artery: Secondary | ICD-10-CM | POA: Diagnosis present

## 2021-06-19 DIAGNOSIS — F03918 Unspecified dementia, unspecified severity, with other behavioral disturbance: Secondary | ICD-10-CM | POA: Diagnosis present

## 2021-06-19 LAB — CBG MONITORING, ED
Glucose-Capillary: 93 mg/dL (ref 70–99)
Glucose-Capillary: 107 mg/dL — ABNORMAL HIGH (ref 70–99)

## 2021-06-19 LAB — TROPONIN I (HIGH SENSITIVITY)
Troponin I (High Sensitivity): 5 ng/L (ref <18)
Troponin I (High Sensitivity): 5 ng/L (ref <18)

## 2021-06-19 LAB — COMPREHENSIVE METABOLIC PANEL
ALT: 15 U/L (ref 0–44)
AST: 21 U/L (ref 15–41)
Albumin: 3.9 g/dL (ref 3.5–5.0)
Alkaline Phosphatase: 100 U/L (ref 38–126)
Anion gap: 9 (ref 5–15)
BUN: 19 mg/dL (ref 8–23)
CO2: 26 mmol/L (ref 22–32)
Calcium: 9.1 mg/dL (ref 8.9–10.3)
Chloride: 103 mmol/L (ref 98–111)
Creatinine, Ser: 1.03 mg/dL (ref 0.61–1.24)
GFR, Estimated: >60 mL/min (ref >60)
Glucose, Bld: 130 mg/dL — ABNORMAL HIGH (ref 70–99)
Potassium: 3.1 mmol/L — ABNORMAL LOW (ref 3.5–5.1)
Sodium: 138 mmol/L (ref 135–145)
Total Bilirubin: 0.5 mg/dL (ref 0.3–1.2)
Total Protein: 7.2 g/dL (ref 6.5–8.1)

## 2021-06-19 LAB — GLUCOSE, CAPILLARY: Glucose-Capillary: 95 mg/dL (ref 70–99)

## 2021-06-19 LAB — URINALYSIS, ROUTINE W REFLEX MICROSCOPIC
Bilirubin Urine: NEGATIVE
Glucose, UA: NEGATIVE mg/dL
Ketones, ur: NEGATIVE mg/dL
Leukocytes,Ua: NEGATIVE
Nitrite: NEGATIVE
Protein, ur: NEGATIVE mg/dL
Specific Gravity, Urine: 1.015 (ref 1.005–1.030)
pH: 6.5 (ref 5.0–8.0)

## 2021-06-19 LAB — RAPID URINE DRUG SCREEN, HOSP PERFORMED
Amphetamines: NOT DETECTED
Barbiturates: NOT DETECTED
Benzodiazepines: NOT DETECTED
Cocaine: NOT DETECTED
Opiates: NOT DETECTED
Tetrahydrocannabinol: NOT DETECTED

## 2021-06-19 LAB — CBC WITH DIFFERENTIAL/PLATELET
Abs Immature Granulocytes: 0.07 10*3/uL (ref 0.00–0.07)
Basophils Absolute: 0.1 10*3/uL (ref 0.0–0.1)
Basophils Relative: 0 %
Eosinophils Absolute: 0.5 10*3/uL (ref 0.0–0.5)
Eosinophils Relative: 4 %
HCT: 42.8 % (ref 39.0–52.0)
Hemoglobin: 14.1 g/dL (ref 13.0–17.0)
Immature Granulocytes: 1 %
Lymphocytes Relative: 24 %
Lymphs Abs: 3.3 10*3/uL (ref 0.7–4.0)
MCH: 29.9 pg (ref 26.0–34.0)
MCHC: 32.9 g/dL (ref 30.0–36.0)
MCV: 90.9 fL (ref 80.0–100.0)
Monocytes Absolute: 0.7 10*3/uL (ref 0.1–1.0)
Monocytes Relative: 5 %
Neutro Abs: 9.1 10*3/uL — ABNORMAL HIGH (ref 1.7–7.7)
Neutrophils Relative %: 66 %
Platelets: 322 10*3/uL (ref 150–400)
RBC: 4.71 MIL/uL (ref 4.22–5.81)
RDW: 14.2 % (ref 11.5–15.5)
WBC: 13.8 10*3/uL — ABNORMAL HIGH (ref 4.0–10.5)
nRBC: 0 % (ref 0.0–0.2)

## 2021-06-19 LAB — RESP PANEL BY RT-PCR (FLU A&B, COVID) ARPGX2
Influenza A by PCR: NEGATIVE
Influenza B by PCR: NEGATIVE
SARS Coronavirus 2 by RT PCR: NEGATIVE

## 2021-06-19 LAB — URINALYSIS, MICROSCOPIC (REFLEX): Bacteria, UA: NONE SEEN

## 2021-06-19 MED ORDER — DONEPEZIL HCL 5 MG PO TABS
10.0000 mg | ORAL_TABLET | Freq: Every day | ORAL | Status: DC
  Administered 2021-06-19: 10 mg via ORAL
  Filled 2021-06-19: qty 2

## 2021-06-19 MED ORDER — POTASSIUM CHLORIDE CRYS ER 20 MEQ PO TBCR: 40.0000 meq | EXTENDED_RELEASE_TABLET | Freq: Once | ORAL | Status: DC

## 2021-06-19 MED ORDER — HYDROCHLOROTHIAZIDE 25 MG PO TABS
12.5000 mg | ORAL_TABLET | Freq: Every morning | ORAL | Status: DC
  Administered 2021-06-19: 12.5 mg via ORAL
  Filled 2021-06-19 (×2): qty 1

## 2021-06-19 MED ORDER — ONDANSETRON HCL 4 MG/2ML IJ SOLN: 4.0000 mg | Freq: Four times a day (QID) | INTRAMUSCULAR | Status: DC | PRN

## 2021-06-19 MED ORDER — ACETAMINOPHEN 325 MG PO TABS: 650.0000 mg | ORAL_TABLET | Freq: Four times a day (QID) | ORAL | Status: DC | PRN

## 2021-06-19 MED ORDER — ACETAMINOPHEN 650 MG RE SUPP: 650.0000 mg | Freq: Four times a day (QID) | RECTAL | Status: DC | PRN

## 2021-06-19 MED ORDER — POTASSIUM CHLORIDE IN NACL 20-0.9 MEQ/L-% IV SOLN
INTRAVENOUS | Status: AC
  Filled 2021-06-19 (×2): qty 1000

## 2021-06-19 MED ORDER — POTASSIUM CHLORIDE 10 MEQ/100ML IV SOLN
10.0000 meq | Freq: Once | INTRAVENOUS | Status: DC
  Filled 2021-06-19: qty 100

## 2021-06-19 MED ORDER — POTASSIUM CHLORIDE CRYS ER 10 MEQ PO TBCR
10.0000 meq | EXTENDED_RELEASE_TABLET | Freq: Every day | ORAL | Status: DC
  Administered 2021-06-19 – 2021-06-20 (×2): 10 meq via ORAL
  Filled 2021-06-19 (×2): qty 1

## 2021-06-19 MED ORDER — LORAZEPAM 2 MG/ML IJ SOLN
0.5000 mg | INTRAMUSCULAR | Status: DC | PRN
  Administered 2021-06-19: 0.5 mg via INTRAVENOUS
  Filled 2021-06-19: qty 1

## 2021-06-19 MED ORDER — ENOXAPARIN SODIUM 40 MG/0.4ML IJ SOSY
40.0000 mg | PREFILLED_SYRINGE | INTRAMUSCULAR | Status: DC
  Administered 2021-06-19 – 2021-06-20 (×2): 40 mg via SUBCUTANEOUS
  Filled 2021-06-19 (×2): qty 0.4

## 2021-06-19 MED ORDER — ASPIRIN EC 81 MG PO TBEC
81.0000 mg | DELAYED_RELEASE_TABLET | Freq: Every day | ORAL | Status: DC
  Administered 2021-06-20: 81 mg via ORAL
  Filled 2021-06-19: qty 1

## 2021-06-19 MED ORDER — ROSUVASTATIN CALCIUM 10 MG PO TABS
5.0000 mg | ORAL_TABLET | Freq: Every day | ORAL | Status: DC
  Administered 2021-06-19 – 2021-06-20 (×2): 5 mg via ORAL
  Filled 2021-06-19 (×2): qty 1

## 2021-06-19 MED ORDER — AMLODIPINE BESYLATE 5 MG PO TABS
10.0000 mg | ORAL_TABLET | Freq: Every day | ORAL | Status: DC
  Administered 2021-06-19 – 2021-06-20 (×2): 10 mg via ORAL
  Filled 2021-06-19 (×2): qty 2

## 2021-06-19 MED ORDER — ENALAPRIL MALEATE 5 MG PO TABS
20.0000 mg | ORAL_TABLET | Freq: Every day | ORAL | Status: DC
  Administered 2021-06-19 – 2021-06-20 (×2): 20 mg via ORAL
  Filled 2021-06-19: qty 1
  Filled 2021-06-19: qty 4
  Filled 2021-06-19: qty 1

## 2021-06-19 MED ORDER — POTASSIUM CHLORIDE 10 MEQ/100ML IV SOLN
10.0000 meq | Freq: Once | INTRAVENOUS | Status: AC
  Administered 2021-06-19: 10 meq via INTRAVENOUS
  Filled 2021-06-19: qty 100

## 2021-06-19 MED ORDER — ENSURE ENLIVE PO LIQD
237.0000 mL | Freq: Three times a day (TID) | ORAL | Status: DC
  Administered 2021-06-19 – 2021-06-20 (×4): 237 mL via ORAL

## 2021-06-19 MED ORDER — ONDANSETRON HCL 4 MG PO TABS: 4.0000 mg | ORAL_TABLET | Freq: Four times a day (QID) | ORAL | Status: DC | PRN

## 2021-06-19 MED ORDER — INSULIN ASPART 100 UNIT/ML IJ SOLN: 0.0000 [IU] | Freq: Three times a day (TID) | INTRAMUSCULAR | Status: DC
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
## 2021-06-19 NOTE — ED Provider Notes (Signed)
[facility] EMERGENCY DEPARTMENT Provider Note   CSN: [id number] Arrival date & time: 06/19/21  0151     History Chief Complaint  Patient presents with   Seth Neal is a 65 y.o. male.  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  Patient brought from her living facility after fall from wheelchair that was witnessed.  He apparently landed on his right side with positive loss of consciousness.  EMS reported that facility said he was unresponsive for several minutes.  He was awake and alert on arrival and following commands intermittently.  He is nonverbal at baseline.  He has no obvious injuries.  Vitals are stable. Over the reported questionable seizure-like activity after the fall.  No history of seizures.  No tongue biting or incontinence. Patient unable to indicate any pain in any area.  He is not able to answer questions.  He is moving all extremities but appears to be weaker on the left side.  Case discussed with Izora Gala at [facility].  She states the patient went limp in his wheelchair and sent to the ground.  He was unresponsive for several minutes but had stable vital signs.  She is not certain if he hit his head.  There was no tongue biting or incontinence.  There is no witnessed seizure-like activity.  The history is provided by the patient and the EMS personnel. The history is limited by the condition of the patient.  Fall      Past Medical History:  Diagnosis Date   Bowel incontinence    Diabetes mellitus without complication ([place])    No meds and blood sugar is not checked   Hyperlipidemia    Hypertension    Stroke [facility])    memory, speech    Urine incontinence     Patient Active Problem List   Diagnosis Date Noted   Acute metabolic encephalopathy 08/67/6195   Diabetes ([place]) 12/04/2015   Impaired fasting glucose 05/31/2015   Hyperlipidemia LDL goal <70 05/09/2014   Essential hypertension, benign 07/09/2013   Incontinence 07/09/2013    Multiple lacunar infarcts ([place]) 07/09/2013   Dementia with behavioral disturbance ([place]) 07/09/2013   Prostate hypertrophy 07/09/2013   Obstructive sleep apnea 07/09/2013   Change in bowel habits 05/09/2012    Past Surgical History:  Procedure Laterality Date   COLONOSCOPY  05/17/2012   Procedure: COLONOSCOPY;  Surgeon: Danie Binder, MD;  Location[facility];  Service: Endoscopy;  Laterality: N/A;  12:30   RADIOLOGY WITH ANESTHESIA Right 02/19/2018   Procedure: MRI OF RIGHT FOOT WITH AND WITHOUT CONTRAST;  Surgeon: Radiologist, Medication, MD;  Location: [address];  Service: Radiology;  Laterality: Right;       Family History  Problem Relation Age of Onset   Colon cancer Neg Hx     Social History   Tobacco Use   Smoking status: Former    Packs/day: 1.00    Types: Cigarettes   Smokeless tobacco: Never   Tobacco comments:    quit 2017 ish   Vaping Use   Vaping Use: Never used  Substance Use Topics   Alcohol use: Not Currently    Comment: occ   Drug use: No    Home Medications Prior to Admission medications   Medication Sig Start Date End Date Taking? Authorizing Provider  acetaminophen (TYLENOL) 325 MG tablet Take 2 tablets (650 mg total) by mouth every 6 (six) hours as needed for mild pain (or Fever >/= 101). 04/30/21  Roxan Hockey, MD  amLODipine (NORVASC) 10 MG tablet Take 10 mg by mouth daily. 02/23/21   [provider]  aspirin EC 81 MG tablet Take 1 tablet (81 mg total) by mouth daily with breakfast. 04/30/21 04/30/22  Roxan Hockey, MD  blood glucose meter kit and supplies KIT Dispense based on patient and insurance preference. Test once every morning.Please dispense lancets, strips,alcohol pads, and etc. Dx:Ell.9  (FOR ICD-9 250.00, 250.01). 03/30/16   [name], MD  donepezil (ARICEPT) 10 MG tablet Take 10 mg by mouth at bedtime.    [provider]  enalapril (VASOTEC) 20 MG tablet Take 1 tablet (20 mg total) by mouth daily. 07/20/16    [name], MD  hydrochlorothiazide (HYDRODIURIL) 12.5 MG tablet Take 1 tablet (12.5 mg total) by mouth every morning. 05/01/21   Roxan Hockey, MD  potassium chloride (KLOR-CON) 10 MEQ tablet Take 1 tablet (10 mEq total) by mouth daily. Take While taking HCTZ 04/30/21   Roxan Hockey, MD  pravastatin (PRAVACHOL) 40 MG tablet Take 40 mg by mouth daily. 12/24/20   [provider]    Allergies    Bee venom  Review of Systems   Review of Systems  Unable to perform ROS: Patient nonverbal   Physical Exam Updated Vital Signs BP (!) 127/93   Pulse 80   Temp 98.2 F (36.8 C) (Axillary)   Resp 13   SpO2 98%   Physical Exam Vitals and nursing note reviewed.  Constitutional:      General: He is not in acute distress.    Appearance: He is well-developed.     Comments: Nonverbal, follows some commands  HENT:     Head: Normocephalic and atraumatic.     Comments: No abrasions or hematomas.    Mouth/Throat:     Pharynx: No oropharyngeal exudate.  Eyes:     Conjunctiva/sclera: Conjunctivae normal.     Pupils: Pupils are equal, round, and reactive to light.  Neck:     Comments: No C-spine tenderness Cardiovascular:     Rate and Rhythm: Normal rate and regular rhythm.     Heart sounds: Normal heart sounds. No murmur heard. Pulmonary:     Effort: Pulmonary effort is normal. No respiratory distress.     Breath sounds: Normal breath sounds.  Chest:     Chest wall: No tenderness.  Abdominal:     Palpations: Abdomen is soft.     Tenderness: There is no abdominal tenderness. There is no guarding or rebound.  Musculoskeletal:        General: No tenderness. Normal range of motion.     Cervical back: Normal range of motion and neck supple.     Comments: No T or L-spine tender  Skin:    General: Skin is warm.  Neurological:     Mental Status: He is alert.     Cranial Nerves: No cranial nerve deficit.     Motor: No abnormal muscle tone.     Coordination: Coordination  normal.     Comments: Nonverbal, follows commands intermittently, appears weaker on the left compared to the right.  Psychiatric:        Behavior: Behavior normal.    ED Results / Procedures / Treatments   Labs (all labs ordered are listed, but only abnormal results are displayed) Labs Reviewed  CBC WITH DIFFERENTIAL/PLATELET - Abnormal; Notable for the following components:      Result Value   WBC 13.8 (*)    Neutro Abs 9.1 (*)  All other components within normal limits  COMPREHENSIVE METABOLIC PANEL - Abnormal; Notable for the following components:   Potassium 3.1 (*)    Glucose, Bld 130 (*)    All other components within normal limits  RESP PANEL BY RT-PCR (FLU A&B, COVID) ARPGX2  URINALYSIS, ROUTINE W REFLEX MICROSCOPIC  RAPID URINE DRUG SCREEN, HOSP PERFORMED  TROPONIN I (HIGH SENSITIVITY)  TROPONIN I (HIGH SENSITIVITY)    EKG EKG Interpretation  Date/Time:  Sunday June 19 2021 02:07:05 EDT Ventricular Rate:  68 PR Interval:  180 QRS Duration: 99 QT Interval:  403 QTC Calculation: 429 R Axis:   55 Text Interpretation: Sinus rhythm Nonspecific T abnormalities, lateral leads No significant change was found Confirmed by Ezequiel Essex [phone]) on 06/19/2021 2:18:49 AM  Radiology CT HEAD WO CONTRAST (5MM)  Result Date: 06/19/2021 CLINICAL DATA:  Fall from wheelchair.  Band of EXAM: CT HEAD WITHOUT CONTRAST CT CERVICAL SPINE WITHOUT CONTRAST TECHNIQUE: Multidetector CT imaging of the head and cervical spine was performed following the standard protocol without intravenous contrast. Multiplanar CT image reconstructions of the cervical spine were also generated. COMPARISON:  04/27/2021 FINDINGS: CT HEAD FINDINGS Brain: No evidence of acute infarction, hemorrhage, hydrocephalus, extra-axial collection or mass lesion/mass effect. Confluent chronic small vessel ischemia in the cerebral white matter with brain atrophy. Vascular: No hyperdense vessel or unexpected  calcification. Skull: Normal. Negative for fracture or focal lesion. Sinuses/Orbits: No acute finding. CT CERVICAL SPINE FINDINGS Alignment: No traumatic malalignment.  Head rotation. Skull base and vertebrae: No acute fracture Soft tissues and spinal canal: No prevertebral fluid or swelling. No visible canal hematoma. Disc levels:  Ordinary degenerative changes. Upper chest: No visible injury Both head and cervical spine CTs are significantly motion degraded requiring multiple acquisitions. Pathology could be obscured due to piecemeal coverage with intermittent severe motion artifact IMPRESSION: 1. Intermittent significant motion artifact. 2. No evidence of intracranial or cervical spine injury. 3. Advanced chronic small-vessel disease. Electronically Signed   By: Monte Fantasia M.D.   On: 06/19/2021 04:33   CT Cervical Spine Wo Contrast  Result Date: 06/19/2021 CLINICAL DATA:  Fall from wheelchair.  Band of EXAM: CT HEAD WITHOUT CONTRAST CT CERVICAL SPINE WITHOUT CONTRAST TECHNIQUE: Multidetector CT imaging of the head and cervical spine was performed following the standard protocol without intravenous contrast. Multiplanar CT image reconstructions of the cervical spine were also generated. COMPARISON:  04/27/2021 FINDINGS: CT HEAD FINDINGS Brain: No evidence of acute infarction, hemorrhage, hydrocephalus, extra-axial collection or mass lesion/mass effect. Confluent chronic small vessel ischemia in the cerebral white matter with brain atrophy. Vascular: No hyperdense vessel or unexpected calcification. Skull: Normal. Negative for fracture or focal lesion. Sinuses/Orbits: No acute finding. CT CERVICAL SPINE FINDINGS Alignment: No traumatic malalignment.  Head rotation. Skull base and vertebrae: No acute fracture Soft tissues and spinal canal: No prevertebral fluid or swelling. No visible canal hematoma. Disc levels:  Ordinary degenerative changes. Upper chest: No visible injury Both head and cervical spine CTs  are significantly motion degraded requiring multiple acquisitions. Pathology could be obscured due to piecemeal coverage with intermittent severe motion artifact IMPRESSION: 1. Intermittent significant motion artifact. 2. No evidence of intracranial or cervical spine injury. 3. Advanced chronic small-vessel disease. Electronically Signed   By: Monte Fantasia M.D.   On: 06/19/2021 04:33    Procedures Procedures   Medications Ordered in ED Medications - No data to display  ED Course  I have reviewed the triage vital signs and the nursing notes.  Pertinent labs &  imaging results that were available during my care of the patient were reviewed by me and considered in my medical decision making (see chart for details).    MDM Rules/Calculators/A&P                          Patient from facility after witnessed fall.  Possible loss of consciousness.  Nonverbal at baseline.  EKG shows sinus rhythm.  No Brugada, no prolonged QT.  CT head and C-spine are negative.  Labs are reassuring, mild hypokalemia.  Wife at bedside feels patient is at his baseline.  He is intermittently following commands and does not speak. No history of seizures.  Patient appeared to have syncopal episode prior to falling out of his wheelchair.  Questionable seizure activity.  Was unresponsive for several minutes per facility staff after falling out of his chair and confused afterwards.  No tongue biting or incontinence.  Family's reports he is back to baseline.  Given concern for possible syncope without prodrome we will plan for observation admission. Discussed with Dr. Olevia Bowens Final Clinical Impression(s) / ED Diagnoses Final diagnoses:  None    Rx / DC Orders ED Discharge Orders     None        Ilia Engelbert, Annie Main, MD 06/19/21 [phone]

## 2021-06-19 NOTE — Progress Notes (Signed)
   06/19/21 1741  Assess: MEWS Score  Temp 99 F (37.2 C)  BP (!) 95/24  Pulse Rate 69  SpO2 100 %  Assess: MEWS Score  MEWS Temp 0  MEWS Systolic 1  MEWS Pulse 0  MEWS RR 0  MEWS LOC 2  MEWS Score 3  MEWS Score Color Yellow  Assess: if the MEWS score is Yellow or Red  Were vital signs taken at a resting state? Yes  Focused Assessment No change from prior assessment  Early Detection of Sepsis Score *See Row Information* Low  MEWS guidelines implemented *See Row Information* Yes  Treat  Breathing 0  Negative Vocalization 0  Facial Expression 0  Body Language 0  Consolability 0  PAINAD Score 0  Take Vital Signs  Increase Vital Sign Frequency  Yellow: Q 2hr X 2 then Q 4hr X 2, if remains yellow, continue Q 4hrs  Escalate  MEWS: Escalate Yellow: discuss with charge nurse/RN and consider discussing with provider and RRT  Notify: Charge Nurse/RN  Name of Charge Nurse/RN Notified Audrea Muscat RN  Date Charge Nurse/RN Notified 06/19/21  Time Charge Nurse/RN Notified 0600  Notify: Provider  Provider Name/Title MD Wynetta Emery  Date Provider Notified 06/19/21  Time Provider Notified 1754  Notification Type  (Secure chat)  Notification Reason Change in status

## 2021-06-19 NOTE — ED Triage Notes (Signed)
Pt BIB [facility] from [facility]. Staff reports fall from wheelchair landing on right side with +LOC. Report seizure like activity unknown prior or following fall. Pt HX dementia and stroke, nonverbal at baseline and able to follow commands. On arrival pt arrives with no obvious injury or deformity. No c-collar. Chest/abd/pelvis atraumatic/non-tender. Pt intermittent following commands. No seizure history. No incontinence. No oral injury.

## 2021-06-19 NOTE — ED Notes (Signed)
Had to feed patient only ate a few bite

## 2021-06-19 NOTE — H&P (Addendum)
History and Physical  [place] FWY:[id number] DOB: 04-Nov-1955 DOA: 06/19/2021  PCP: Pcp, No  Patient coming from: [facility] Level of care: Telemetry  I have personally briefly reviewed patient's old medical records in [place]  Chief Complaint: Fall   HPI: Seth Neal is a 65 y.o. male with medical history significant devastating prior stroke leaving him nonverbal with severe vascular dementia, nonambulatory, type 2 diabetes mellitus, hypertension, hyperlipidemia, urine and bowel incontinence who was sent by EMS from [facility] after staff reported that he fell from wheelchair landing on his right side and there was some loss of consciousness reported.  There was no known seizure activity prior to or following a fall.  EMS reported that staff told them that patient was unresponsive for several minutes.  They found him to be awake and alert and he was able to follow commands intermittently.  He had no obvious injuries.  There was no tongue biting or incontinence.  Apparently patient went limp in the wheelchair and slid to the ground.  His vital signs remained stable during the time when he was unresponsive for several minutes.  It is unknown if he hit his head.  No seizure activity was witnessed. ED Course: CT head and CT spine negative.  His labs reveal a mild hypokalemia otherwise reassuring.  Family at bedside reported that patient seems to be at baseline.  Observation admission was requested.  Review of Systems: Review of Systems  Unable to perform ROS: Dementia (Patient nonverbal as well)    Past Medical History:  Diagnosis Date   Bowel incontinence    Diabetes mellitus without complication ([place])    No meds and blood sugar is not checked   Hyperlipidemia    Hypertension    Stroke [facility])    memory, speech    Urine incontinence     Past Surgical History:  Procedure Laterality Date   COLONOSCOPY  05/17/2012   Procedure: COLONOSCOPY;  Surgeon: Danie Binder, MD;  Location[facility];  Service: Endoscopy;  Laterality: N/A;  12:30   RADIOLOGY WITH ANESTHESIA Right 02/19/2018   Procedure: MRI OF RIGHT FOOT WITH AND WITHOUT CONTRAST;  Surgeon: Radiologist, Medication, MD;  Location: [facility];  Service: Radiology;  Laterality: Right;     reports that he has quit smoking. His smoking use included cigarettes. He smoked an average of 1 pack per day. He has never used smokeless tobacco. He reports that he does not currently use alcohol. He reports that he does not use drugs.  Allergies  Allergen Reactions   Bee Venom Hypertension    Family History  Problem Relation Age of Onset   Colon cancer Neg Hx     Prior to Admission medications   Medication Sig Start Date End Date Taking? Authorizing Provider  acetaminophen (TYLENOL) 325 MG tablet Take 2 tablets (650 mg total) by mouth every 6 (six) hours as needed for mild pain (or Fever >/= 101). 04/30/21   Emokpae, Courage, MD  amLODipine (NORVASC) 10 MG tablet Take 10 mg by mouth daily. 02/23/21   [provider]  aspirin EC 81 MG tablet Take 1 tablet (81 mg total) by mouth daily with breakfast. 04/30/21 04/30/22  Roxan Hockey, MD  blood glucose meter kit and supplies KIT Dispense based on patient and insurance preference. Test once every morning.Please dispense lancets, strips,alcohol pads, and etc. Dx:Ell.9  (FOR ICD-9 250.00, 250.01). 03/30/16   [name], MD  donepezil (ARICEPT) 10 MG  tablet Take 10 mg by mouth at bedtime.    [provider]  enalapril (VASOTEC) 20 MG tablet Take 1 tablet (20 mg total) by mouth daily. 07/20/16   [name], MD  hydrochlorothiazide (HYDRODIURIL) 12.5 MG tablet Take 1 tablet (12.5 mg total) by mouth every morning. 05/01/21   Roxan Hockey, MD  potassium chloride (KLOR-CON) 10 MEQ tablet Take 1 tablet (10 mEq total) by mouth daily. Take While taking HCTZ 04/30/21   Roxan Hockey, MD  pravastatin (PRAVACHOL) 40 MG tablet Take 40 mg by  mouth daily. 12/24/20   [provider]    Physical Exam: Vitals:   06/19/21 0745 06/19/21 0800 06/19/21 0815 06/19/21 0830  BP:  (!) 137/109  (!) 150/84  Pulse: (!) 55 89 91 75  Resp: _0 Temp:      TempSrc:      SpO2: 92% 98% 100% 100%    Constitutional: Patient is nonverbal but follows some commands.  He is awake and alert.  Does not appear to be in distress. Eyes: PERRL, lids and conjunctivae normal ENMT: Mucous membranes are moist. Posterior pharynx clear of any exudate or lesions.poor dentition.  Neck: normal, supple, no masses, no thyromegaly Respiratory: clear to auscultation bilaterally, no wheezing, no crackles. Normal respiratory effort. No accessory muscle use.  Cardiovascular: normal s1, s2 sounds, no murmurs / rubs / gallops. No extremity edema. 2+ pedal pulses. No carotid bruits.  Abdomen: no tenderness, no masses palpated. No hepatosplenomegaly. Bowel sounds positive.  Musculoskeletal: no clubbing / cyanosis. No joint deformity upper and lower extremities. Good ROM, no contractures. Normal muscle tone.  Skin: no rashes, lesions, ulcers. No induration Neurologic: CN 2-12 grossly intact.  Mild left hemiparesis Psychiatric: Normal judgment and insight. Alert and oriented x 3. Normal mood.   Labs on Admission: I have personally reviewed following labs and imaging studies  CBC: Recent Labs  Lab 06/19/21 0218  WBC 13.8*  NEUTROABS 9.1*  HGB 14.1  HCT 42.8  MCV 90.9  PLT 938   Basic Metabolic Panel: Recent Labs  Lab 06/19/21 0218  NA 138  K 3.1*  CL 103  CO2 26  GLUCOSE 130*  BUN 19  CREATININE 1.03  CALCIUM 9.1   GFR: CrCl cannot be calculated (Unknown ideal weight.). Liver Function Tests: Recent Labs  Lab 06/19/21 0218  AST 21  ALT 15  ALKPHOS 100  BILITOT 0.5  PROT 7.2  ALBUMIN 3.9   No results for input(s): LIPASE, AMYLASE in the last 168 hours. No results for input(s): AMMONIA in the last 168 hours. Coagulation  Profile: No results for input(s): INR, PROTIME in the last 168 hours. Cardiac Enzymes: No results for input(s): CKTOTAL, CKMB, CKMBINDEX, TROPONINI in the last 168 hours. BNP (last 3 results) No results for input(s): PROBNP in the last 8760 hours. HbA1C: No results for input(s): HGBA1C in the last 72 hours. CBG: No results for input(s): GLUCAP in the last 168 hours. Lipid Profile: No results for input(s): CHOL, HDL, LDLCALC, TRIG, CHOLHDL, LDLDIRECT in the last 72 hours. Thyroid Function Tests: No results for input(s): TSH, T4TOTAL, FREET4, T3FREE, THYROIDAB in the last 72 hours. Anemia Panel: No results for input(s): VITAMINB12, FOLATE, FERRITIN, TIBC, IRON, RETICCTPCT in the last 72 hours. Urine analysis:    Component Value Date/Time   COLORURINE YELLOW 04/27/2021 1853   APPEARANCEUR HAZY (A) 04/27/2021 1853   LABSPEC 1.016 04/27/2021 1853   PHURINE 6.0 04/27/2021 1853   GLUCOSEU NEGATIVE 04/27/2021 1853  HGBUR LARGE (A) 04/27/2021 1853   BILIRUBINUR NEGATIVE 04/27/2021 Uncertain NEGATIVE 04/27/2021 1853   PROTEINUR 30 (A) 04/27/2021 1853   NITRITE NEGATIVE 04/27/2021 1853   LEUKOCYTESUR TRACE (A) 04/27/2021 1853    Radiological Exams on Admission: CT HEAD WO CONTRAST (5MM)  Result Date: 06/19/2021 CLINICAL DATA:  Fall from wheelchair.  Band of EXAM: CT HEAD WITHOUT CONTRAST CT CERVICAL SPINE WITHOUT CONTRAST TECHNIQUE: Multidetector CT imaging of the head and cervical spine was performed following the standard protocol without intravenous contrast. Multiplanar CT image reconstructions of the cervical spine were also generated. COMPARISON:  04/27/2021 FINDINGS: CT HEAD FINDINGS Brain: No evidence of acute infarction, hemorrhage, hydrocephalus, extra-axial collection or mass lesion/mass effect. Confluent chronic small vessel ischemia in the cerebral white matter with brain atrophy. Vascular: No hyperdense vessel or unexpected calcification. Skull: Normal. Negative for  fracture or focal lesion. Sinuses/Orbits: No acute finding. CT CERVICAL SPINE FINDINGS Alignment: No traumatic malalignment.  Head rotation. Skull base and vertebrae: No acute fracture Soft tissues and spinal canal: No prevertebral fluid or swelling. No visible canal hematoma. Disc levels:  Ordinary degenerative changes. Upper chest: No visible injury Both head and cervical spine CTs are significantly motion degraded requiring multiple acquisitions. Pathology could be obscured due to piecemeal coverage with intermittent severe motion artifact IMPRESSION: 1. Intermittent significant motion artifact. 2. No evidence of intracranial or cervical spine injury. 3. Advanced chronic small-vessel disease. Electronically Signed   By: Monte Fantasia M.D.   On: 06/19/2021 04:33   CT Cervical Spine Wo Contrast  Result Date: 06/19/2021 CLINICAL DATA:  Fall from wheelchair.  Band of EXAM: CT HEAD WITHOUT CONTRAST CT CERVICAL SPINE WITHOUT CONTRAST TECHNIQUE: Multidetector CT imaging of the head and cervical spine was performed following the standard protocol without intravenous contrast. Multiplanar CT image reconstructions of the cervical spine were also generated. COMPARISON:  04/27/2021 FINDINGS: CT HEAD FINDINGS Brain: No evidence of acute infarction, hemorrhage, hydrocephalus, extra-axial collection or mass lesion/mass effect. Confluent chronic small vessel ischemia in the cerebral white matter with brain atrophy. Vascular: No hyperdense vessel or unexpected calcification. Skull: Normal. Negative for fracture or focal lesion. Sinuses/Orbits: No acute finding. CT CERVICAL SPINE FINDINGS Alignment: No traumatic malalignment.  Head rotation. Skull base and vertebrae: No acute fracture Soft tissues and spinal canal: No prevertebral fluid or swelling. No visible canal hematoma. Disc levels:  Ordinary degenerative changes. Upper chest: No visible injury Both head and cervical spine CTs are significantly motion degraded requiring  multiple acquisitions. Pathology could be obscured due to piecemeal coverage with intermittent severe motion artifact IMPRESSION: 1. Intermittent significant motion artifact. 2. No evidence of intracranial or cervical spine injury. 3. Advanced chronic small-vessel disease. Electronically Signed   By: Monte Fantasia M.D.   On: 06/19/2021 04:33    EKG: Independently reviewed.  No acute ST-T wave abnormalities seen.  Assessment/Plan Principal Problem:   Syncope and collapse Active Problems:   Essential hypertension, benign   Multiple lacunar infarcts (HCC)   Dementia with behavioral disturbance (HCC)   Prostate hypertrophy   Obstructive sleep apnea   Hyperlipidemia LDL goal <70   Diabetes ([place])    Syncopal event - I suspect based on the description of the event that the patient had a vasovagal syncopal episode.  However, seizure is in the differential and would like to get an EEG if possible.  We will place him on seizure precautions and will hydrate him with IV fluids.  We will do neurochecks.  We will replace  his potassium and check his magnesium.  Cerebrovascular disease/vascular dementia-we will resume his home medications when fully reconciled by the pharmacy.  Hypertension-resume home medications.  Type 2 diabetes mellitus-controlled as evidenced by hemoglobin A1c of 6.7%.  SSI coverage and CBG monitoring ordered with a carbohydrate modified diet.  Hypokalemia-IV replacement ordered, check magnesium level.  Fall-no obvious injuries found, imaging has been unrevealing.  Follow clinically.  Tylenol as needed for pain symptoms.  DVT prophylaxis: enoxaparin  Code Status: full   Family Communication: bedside update   Disposition Plan: anticipate return to SNF on 9/5 or 9/6   Consults called: n/a   Admission status: OBV  Level of care: Telemetry Irwin Brakeman MD [facility] How to contact the [facility] Attending or Consulting provider [place] or covering provider during after  hours [place], for this patient?  Check the care team in [facility] and look for a) attending/consulting TR[facility] provider listed and b) the [facility] team listed Log into [email or web address] and use [place]'s universal password to access. If you do not have the password, please contact the hospital operator. Locate the [facility] provider you are looking for under [facility] and page to a number that you can be directly reached. If you still have difficulty reaching the provider, please page the [facility] (Director on Call) for the Hospitalists listed on amion for assistance.   If 7PM-7AM, please contact night-coverage [email or web address] Password [email or web address]  06/19/2021, 9:02 AM

## 2021-06-20 DIAGNOSIS — R55 Syncope and collapse: Secondary | ICD-10-CM | POA: Diagnosis not present

## 2021-06-20 DIAGNOSIS — F0151 Vascular dementia with behavioral disturbance: Secondary | ICD-10-CM | POA: Diagnosis not present

## 2021-06-20 DIAGNOSIS — G4733 Obstructive sleep apnea (adult) (pediatric): Secondary | ICD-10-CM

## 2021-06-20 DIAGNOSIS — E785 Hyperlipidemia, unspecified: Secondary | ICD-10-CM | POA: Diagnosis not present

## 2021-06-20 DIAGNOSIS — I10 Essential (primary) hypertension: Secondary | ICD-10-CM | POA: Diagnosis not present

## 2021-06-20 LAB — BASIC METABOLIC PANEL
Anion gap: 7 (ref 5–15)
BUN: 13 mg/dL (ref 8–23)
CO2: 27 mmol/L (ref 22–32)
Calcium: 8.8 mg/dL — ABNORMAL LOW (ref 8.9–10.3)
Chloride: 106 mmol/L (ref 98–111)
Creatinine, Ser: 0.83 mg/dL (ref 0.61–1.24)
GFR, Estimated: >60 mL/min (ref >60)
Glucose, Bld: 90 mg/dL (ref 70–99)
Potassium: 3.5 mmol/L (ref 3.5–5.1)
Sodium: 140 mmol/L (ref 135–145)

## 2021-06-20 LAB — GLUCOSE, CAPILLARY
Glucose-Capillary: 92 mg/dL (ref 70–99)
Glucose-Capillary: 90 mg/dL (ref 70–99)
Glucose-Capillary: 85 mg/dL (ref 70–99)

## 2021-06-20 LAB — CBC WITH DIFFERENTIAL/PLATELET
Abs Immature Granulocytes: 0.04 10*3/uL (ref 0.00–0.07)
Basophils Absolute: 0.1 10*3/uL (ref 0.0–0.1)
Basophils Relative: 1 %
Eosinophils Absolute: 0.5 10*3/uL (ref 0.0–0.5)
Eosinophils Relative: 5 %
HCT: 39.9 % (ref 39.0–52.0)
Hemoglobin: 13.1 g/dL (ref 13.0–17.0)
Immature Granulocytes: 0 %
Lymphocytes Relative: 32 %
Lymphs Abs: 3.1 10*3/uL (ref 0.7–4.0)
MCH: 29.6 pg (ref 26.0–34.0)
MCHC: 32.8 g/dL (ref 30.0–36.0)
MCV: 90.1 fL (ref 80.0–100.0)
Monocytes Absolute: 0.6 10*3/uL (ref 0.1–1.0)
Monocytes Relative: 6 %
Neutro Abs: 5.5 10*3/uL (ref 1.7–7.7)
Neutrophils Relative %: 56 %
Platelets: 330 10*3/uL (ref 150–400)
RBC: 4.43 MIL/uL (ref 4.22–5.81)
RDW: 14.3 % (ref 11.5–15.5)
WBC: 9.8 10*3/uL (ref 4.0–10.5)
nRBC: 0 % (ref 0.0–0.2)

## 2021-06-20 MED ORDER — METOPROLOL TARTRATE 5 MG/5ML IV SOLN: 5.0000 mg | INTRAVENOUS | Status: DC | PRN

## 2021-06-20 NOTE — NC FL2 (Signed)
[place] LEVEL OF CARE SCREENING TOOL     IDENTIFICATION  Patient Name: Seth Neal Birthdate: 20-May-1956 Sex: male Admission Date (Current Location): 06/19/2021  [place] and [place] Number:  [place] [phone] [place] and Address:  [facility] [address] [place]      Provider Number: [id number]  Attending Physician Name and Address:  Murlean Iba, MD  Relative Name and Phone Number:  Luckenbach,[name] (Spouse)   [phone]    Current Level of Care: Hospital (observation) Recommended Level of Care: [place] Prior Approval Number:    Date Approved/Denied:   PASRR Number:    Discharge Plan: SNF    Current Diagnoses: Patient Active Problem List   Diagnosis Date Noted   Syncope and collapse 38/25/0539   Acute metabolic encephalopathy 76/73/4193   Diabetes ([place]) 12/04/2015   Impaired fasting glucose 05/31/2015   Hyperlipidemia LDL goal <70 05/09/2014   Essential hypertension, benign 07/09/2013   Incontinence 07/09/2013   Multiple lacunar infarcts ([place]) 07/09/2013   Dementia with behavioral disturbance ([place]) 07/09/2013   Prostate hypertrophy 07/09/2013   Obstructive sleep apnea 07/09/2013   Change in bowel habits 05/09/2012    Orientation RESPIRATION BLADDER Height & Weight     Self  Normal Incontinent Weight: 158 lb 11.7 oz (72 kg) Height:     BEHAVIORAL SYMPTOMS/MOOD NEUROLOGICAL BOWEL NUTRITION STATUS      Incontinent Diet (heart healthy)  AMBULATORY STATUS COMMUNICATION OF NEEDS Skin   Extensive Assist (uses w/c) Verbally Normal                       Personal Care Assistance Level of Assistance  Bathing, Feeding, Dressing Bathing Assistance: Maximum assistance Feeding assistance: Limited assistance Dressing Assistance: Maximum assistance     Functional Limitations Info  Sight, Speech, Hearing Sight Info: Adequate Hearing Info: Adequate Speech Info: Impaired (Patient is mostly  nonverbal)    SPECIAL CARE FACTORS FREQUENCY                       Contractures Contractures Info: Not present    Additional Factors Info  Code Status, Allergies Code Status Info: Full Code Allergies Info: NKA           Current Medications (06/20/2021):  This is the current hospital active medication list Current Facility-Administered Medications  Medication Dose Route Frequency Provider Last Rate Last Admin   acetaminophen (TYLENOL) tablet 650 mg  650 mg Oral Q6H PRN Johnson, Clanford L, MD       Or   acetaminophen (TYLENOL) suppository 650 mg  650 mg Rectal Q6H PRN Johnson, Clanford L, MD       amLODipine (NORVASC) tablet 10 mg  10 mg Oral Daily Johnson, Clanford L, MD   10 mg at 06/20/21 7902   aspirin EC tablet 81 mg  81 mg Oral Q breakfast Johnson, Clanford L, MD   81 mg at 06/20/21 0806   donepezil (ARICEPT) tablet 10 mg  10 mg Oral QHS Johnson, Clanford L, MD   10 mg at 06/19/21 2218   enalapril (VASOTEC) tablet 20 mg  20 mg Oral Daily Johnson, Clanford L, MD   20 mg at 06/20/21 0806   enoxaparin (LOVENOX) injection 40 mg  40 mg Subcutaneous Q24H Johnson, Clanford L, MD   40 mg at 06/20/21 0830   feeding supplement (ENSURE ENLIVE / ENSURE PLUS) liquid 237 mL  237 mL Oral TID Murlean Iba, MD  237 mL at 06/20/21 0830   hydrochlorothiazide (HYDRODIURIL) tablet 12.5 mg  12.5 mg Oral q morning Johnson, Clanford L, MD   12.5 mg at 06/19/21 1331   insulin aspart (novoLOG) injection 0-9 Units  0-9 Units Subcutaneous TID WC Johnson, Clanford L, MD       LORazepam (ATIVAN) injection 0.5 mg  0.5 mg Intravenous Q4H PRN Johnson, Clanford L, MD   0.5 mg at 06/19/21 1213   ondansetron (ZOFRAN) tablet 4 mg  4 mg Oral Q6H PRN Johnson, Clanford L, MD       Or   ondansetron (ZOFRAN) injection 4 mg  4 mg Intravenous Q6H PRN Johnson, Clanford L, MD       potassium chloride (KLOR-CON) CR tablet 10 mEq  10 mEq Oral Daily Johnson, Clanford L, MD   10 mEq at 06/20/21 0806    rosuvastatin (CRESTOR) tablet 5 mg  5 mg Oral Daily Johnson, Clanford L, MD   5 mg at 06/20/21 8832     Discharge Medications: Please see discharge summary for a list of discharge medications.  Relevant Imaging Results:  Relevant Lab Results:   Additional Information    Lincon Sahlin, Clydene Pugh, LCSW

## 2021-06-20 NOTE — Progress Notes (Signed)
Called report to [employer], Daughter Tomeka informed. IV removed patient just waiting on EMS to arrive for transport.

## 2021-06-20 NOTE — Plan of Care (Signed)

## 2021-06-20 NOTE — Care Management Obs Status (Signed)
[place] NOTIFICATION   Patient Details  Name: Seth Neal MRN: [id number] Date of Birth: [birth year]   Medicare Observation Status Notification Given:  Yes Adline Mango notified daughter on 06/19/21)    [name] 06/20/2021, 11:52 AM

## 2021-06-20 NOTE — TOC Initial Note (Addendum)
Transition of Care [facility]) - Initial/Assessment Note    Patient Details  Name: Seth Neal MRN: [id number] Date of Birth: 22-Feb-1956  Transition of Care [facility]) CM/SW Contact:    Ihor Gully, LCSW Phone Number: 06/20/2021, 11:01 AM  Clinical Narrative:                 Patient is a LTC resident at [facility]. He admitted for syncope and collapse. Debbie at [facility] is agreeable to patient discharge and return to facility today.  Message left for spouse advising of d/c.  Expected Discharge Plan: Skilled Nursing Facility Barriers to Discharge: No Barriers Identified   Patient Goals and CMS Choice Patient states their goals for this hospitalization and ongoing recovery are:: patient is LTC at [facility]      Expected Discharge Plan and Services Expected Discharge Plan: [place]       Living arrangements for the past 2 months: [place]                                      Prior Living Arrangements/Services Living arrangements for the past 2 months: [place] Lives with:: Facility Resident Patient language and need for interpreter reviewed:: Yes Do you feel safe going back to the place where you live?: Yes      Need for Family Participation in Patient Care: Yes (Comment) Care giver support system in place?: Yes (comment)   Criminal Activity/Legal Involvement Pertinent to Current Situation/Hospitalization: No - Comment as needed  Activities of Daily Living      Permission Sought/Granted Permission sought to share information with : Facility Sport and exercise psychologist    Share Information with NAME: Debbie at [facility] (typically observed): Appropriate Orientation: : Oriented to Self Alcohol / Substance Use: Not Applicable    Admission diagnosis:  Syncope and collapse [R55] Patient Active Problem List   Diagnosis Date Noted   Syncope and collapse 71/69/6789   Acute  metabolic encephalopathy 38/07/1750   Diabetes ([place]) 12/04/2015   Impaired fasting glucose 05/31/2015   Hyperlipidemia LDL goal <70 05/09/2014   Essential hypertension, benign 07/09/2013   Incontinence 07/09/2013   Multiple lacunar infarcts ([place]) 07/09/2013   Dementia with behavioral disturbance ([place]) 07/09/2013   Prostate hypertrophy 07/09/2013   Obstructive sleep apnea 07/09/2013   Change in bowel habits 05/09/2012   PCP:  Pcp, No Pharmacy:   [facility] [place] - [address] [place] [address] Phone: [phone] Fax: [id number] - [place], [place] AT [address] [place] [address] Phone: [phone] Fax: [phone]     Social Determinants of Health (SDOH) Interventions    Readmission Risk Interventions No flowsheet data found.

## 2021-06-20 NOTE — Discharge Instructions (Signed)

## 2021-06-20 NOTE — Discharge Summary (Signed)
Physician Discharge Summary  Seth Neal EEF:[id number] DOB: 1956/03/03 DOA: 06/19/2021   Admit date: 06/19/2021 Discharge date: 06/20/2021  Admitted From: [facility]  Disposition: [facility]   Recommendations for Outpatient Follow-up:   Please request a palliative medicine consultation for goals of care  Discharge Condition: STABLE at BASELINE   CODE STATUS: FULL DIET: dysphagia 2  heart healthy, carb modified   Brief Hospitalization Summary: Please see all hospital notes, images, labs for full details of the hospitalization. HPI: Seth Neal is a 65 y.o. 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  There was no known seizure activity prior to or following a fall.  EMS reported that staff told them that patient was unresponsive for several minutes.  They found him to be awake and alert and he was able to follow commands intermittently.  He had no obvious injuries.  There was no tongue biting or incontinence.  Apparently patient went limp in the wheelchair and slid to the ground.  His vital signs remained stable during the time when he was unresponsive for several minutes.  It is unknown if he hit his head.  No seizure activity was witnessed. ED Course: CT head and CT spine negative.  His labs reveal a mild hypokalemia otherwise reassuring.  Family at bedside reported that patient seems to be at baseline.  Observation admission was requested.  Hospital Course:    Syncopal event - I suspect based on the description of the event that the patient had a vasovagal syncopal episode.  However, seizure is in the differential and would like to get an EEG if possible however  unable to get EEG at this facility due to holiday weekend.  We have hydrated him with IV fluids and he responded well.  He seems to be back to his reported baseline.  We replaced his potassium.    Cerebrovascular disease/vascular dementia-we will resume his home medications when fully reconciled by the pharmacy.   Hypertension-resume home medications.   Type 2 diabetes mellitus-controlled as evidenced by hemoglobin A1c of 6.7%.  SSI coverage and CBG monitoring ordered with a carbohydrate modified diet.  RESUME HOME DIET CONTROL MANAGEMENT AT DISCHARGE   Hypokalemia-IV replacement given and repleted, checked magnesium level.   Fall-no obvious injuries found, imaging has been unrevealing.  Follow clinically.  Tylenol as needed for pain symptoms.   DVT prophylaxis: enoxaparin  Code Status: full   Family Communication: bedside update   Disposition Plan: return to SNF on 9/5   Consults called: n/a   Admission status: OBV   Discharge Diagnoses:  Principal Problem:   Syncope and collapse Active Problems:   Essential hypertension, benign   Multiple lacunar infarcts ([place])   Dementia with behavioral disturbance (HCC)   Prostate hypertrophy   Obstructive sleep apnea   Hyperlipidemia LDL goal <70   Diabetes ([place])   Discharge Instructions:  Allergies as of 06/20/2021       Reactions   Bee Venom Hypertension        Medication List     TAKE these medications    acetaminophen 325 MG tablet Commonly known as: TYLENOL Take 2 tablets (650 mg total) by mouth every 6 (six) hours as needed for mild pain (or Fever >/=  101).   amLODipine 10 MG tablet Commonly known as: NORVASC Take 10 mg by mouth daily.   aspirin EC 81 MG tablet Take 1 tablet (81 mg total) by mouth daily with breakfast.   blood glucose meter kit and supplies Kit Dispense based on patient and insurance preference. Test once every morning.Please dispense lancets, strips,alcohol pads, and etc. Dx:Ell.9  (FOR ICD-9  250.00, 250.01).   donepezil 10 MG tablet Commonly known as: ARICEPT Take 10 mg by mouth at bedtime.   enalapril 20 MG tablet Commonly known as: VASOTEC Take 1 tablet (20 mg total) by mouth daily.   Ensure Take 237 mLs by mouth 3 (three) times daily.   hydrochlorothiazide 12.5 MG tablet Commonly known as: HYDRODIURIL Take 1 tablet (12.5 mg total) by mouth every morning.   potassium chloride 10 MEQ tablet Commonly known as: KLOR-CON Take 1 tablet (10 mEq total) by mouth daily. Take While taking HCTZ   rosuvastatin 5 MG tablet Commonly known as: CRESTOR Take 5 mg by mouth daily.        Allergies  Allergen Reactions   Bee Venom Hypertension   Allergies as of 06/20/2021       Reactions   Bee Venom Hypertension        Medication List     TAKE these medications    acetaminophen 325 MG tablet Commonly known as: TYLENOL Take 2 tablets (650 mg total) by mouth every 6 (six) hours as needed for mild pain (or Fever >/= 101).   amLODipine 10 MG tablet Commonly known as: NORVASC Take 10 mg by mouth daily.   aspirin EC 81 MG tablet Take 1 tablet (81 mg total) by mouth daily with breakfast.   blood glucose meter kit and supplies Kit Dispense based on patient and insurance preference. Test once every morning.Please dispense lancets, strips,alcohol pads, and etc. Dx:Ell.9  (FOR ICD-9 250.00, 250.01).   donepezil 10 MG tablet Commonly known as: ARICEPT Take 10 mg by mouth at bedtime.   enalapril 20 MG tablet Commonly known as: VASOTEC Take 1 tablet (20 mg total) by mouth daily.   Ensure Take 237 mLs by mouth 3 (three) times daily.   hydrochlorothiazide 12.5 MG tablet Commonly known as: HYDRODIURIL Take 1 tablet (12.5 mg total) by mouth every morning.   potassium chloride 10 MEQ tablet Commonly known as: KLOR-CON Take 1 tablet (10 mEq total) by mouth daily. Take While taking HCTZ   rosuvastatin 5 MG tablet Commonly known as: CRESTOR Take 5 mg by mouth  daily.        Procedures/Studies: CT HEAD WO CONTRAST (5MM)  Result Date: 06/19/2021 CLINICAL DATA:  Fall from wheelchair.  Band of EXAM: CT HEAD WITHOUT CONTRAST CT CERVICAL SPINE WITHOUT CONTRAST TECHNIQUE: Multidetector CT imaging of the head and cervical spine was performed following the standard protocol without intravenous contrast. Multiplanar CT image reconstructions of the cervical spine were also generated. COMPARISON:  04/27/2021 FINDINGS: CT HEAD FINDINGS Brain: No evidence of acute infarction, hemorrhage, hydrocephalus, extra-axial collection or mass lesion/mass effect. Confluent chronic small vessel ischemia in the cerebral white matter with brain atrophy. Vascular: No hyperdense vessel or unexpected calcification. Skull: Normal. Negative for fracture or focal lesion. Sinuses/Orbits: No acute finding. CT CERVICAL SPINE FINDINGS Alignment: No traumatic malalignment.  Head rotation. Skull base and vertebrae: No acute fracture Soft tissues and spinal canal: No prevertebral fluid or swelling. No visible canal hematoma. Disc levels:  Ordinary degenerative changes. Upper chest: No visible injury Both head and cervical spine  CTs are significantly motion degraded requiring multiple acquisitions. Pathology could be obscured due to piecemeal coverage with intermittent severe motion artifact IMPRESSION: 1. Intermittent significant motion artifact. 2. No evidence of intracranial or cervical spine injury. 3. Advanced chronic small-vessel disease. Electronically Signed   By: Monte Fantasia M.D.   On: 06/19/2021 04:33   CT Cervical Spine Wo Contrast  Result Date: 06/19/2021 CLINICAL DATA:  Fall from wheelchair.  Band of EXAM: CT HEAD WITHOUT CONTRAST CT CERVICAL SPINE WITHOUT CONTRAST TECHNIQUE: Multidetector CT imaging of the head and cervical spine was performed following the standard protocol without intravenous contrast. Multiplanar CT image reconstructions of the cervical spine were also generated.  COMPARISON:  04/27/2021 FINDINGS: CT HEAD FINDINGS Brain: No evidence of acute infarction, hemorrhage, hydrocephalus, extra-axial collection or mass lesion/mass effect. Confluent chronic small vessel ischemia in the cerebral white matter with brain atrophy. Vascular: No hyperdense vessel or unexpected calcification. Skull: Normal. Negative for fracture or focal lesion. Sinuses/Orbits: No acute finding. CT CERVICAL SPINE FINDINGS Alignment: No traumatic malalignment.  Head rotation. Skull base and vertebrae: No acute fracture Soft tissues and spinal canal: No prevertebral fluid or swelling. No visible canal hematoma. Disc levels:  Ordinary degenerative changes. Upper chest: No visible injury Both head and cervical spine CTs are significantly motion degraded requiring multiple acquisitions. Pathology could be obscured due to piecemeal coverage with intermittent severe motion artifact IMPRESSION: 1. Intermittent significant motion artifact. 2. No evidence of intracranial or cervical spine injury. 3. Advanced chronic small-vessel disease. Electronically Signed   By: Monte Fantasia M.D.   On: 06/19/2021 04:33     Subjective: Pt appears to be back to his baseline, no observed seizure activities and no recurrent syncope  Discharge Exam: Vitals:   06/20/21 0541 06/20/21 0806  BP: (!) 121/96 (!) 150/99  Pulse: 86   Resp: 14   Temp: 98.2 F (36.8 C)   SpO2: 98%    Vitals:   06/20/21 0100 06/20/21 0132 06/20/21 0541 06/20/21 0806  BP: (!) 130/92 137/68 (!) 121/96 (!) 150/99  Pulse: 67 64 86   Resp: _0 Temp: 98.5 F (36.9 C) 97.9 F (36.6 C) 98.2 F (36.8 C)   TempSrc: Axillary Oral Oral   SpO2: 100% 91% 98%   Weight:       General: Pt is alert, awake, not in acute distress Cardiovascular: RRR, S1/S2 +, no rubs, no gallops Respiratory: CTA bilaterally, no wheezing, no rhonchi Abdominal: Soft, NT, ND, bowel sounds + Extremities: no edema, no cyanosis   The results of significant  diagnostics from this hospitalization (including imaging, microbiology, ancillary and laboratory) are listed below for reference.     Microbiology: Recent Results (from the past 240 hour(s))  Resp Panel by RT-PCR (Flu A&B, Covid) Nasopharyngeal Swab     Status: None   Collection Time: 06/19/21  9:27 AM   Specimen: Nasopharyngeal Swab; Nasopharyngeal(NP) swabs in vial transport medium  Result Value Ref Range Status   SARS Coronavirus 2 by RT PCR NEGATIVE NEGATIVE Final    Comment: (NOTE) SARS-CoV-2 target nucleic acids are NOT DETECTED.  The SARS-CoV-2 RNA is generally detectable in upper respiratory specimens during the acute phase of infection. The lowest concentration of SARS-CoV-2 viral copies this assay can detect is 138 copies/mL. A negative result does not preclude SARS-Cov-2 infection and should not be used as the sole basis for treatment or other patient management decisions. A negative result may occur with  improper specimen collection/handling, submission of specimen other  than nasopharyngeal swab, presence of viral mutation(s) within the areas targeted by this assay, and inadequate number of viral copies(<138 copies/mL). A negative result must be combined with clinical observations, patient history, and epidemiological information. The expected result is Negative.  Fact Sheet for Patients:  EntrepreneurPulse.com.au  Fact Sheet for Healthcare Providers:  IncredibleEmployment.be  This test is no t yet approved or cleared by the [place] FDA and  has been authorized for detection and/or diagnosis of SARS-CoV-2 by FDA under an Emergency Use Authorization (EUA). This EUA will remain  in effect (meaning this test can be used) for the duration of the COVID-19 declaration under Section 564(b)(1) of the Act, 21 U.S.C.section 360bbb-3(b)(1), unless the authorization is terminated  or revoked sooner.       Influenza A by PCR NEGATIVE  NEGATIVE Final   Influenza B by PCR NEGATIVE NEGATIVE Final    Comment: (NOTE) The Xpert Xpress SARS-CoV-2/FLU/RSV plus assay is intended as an aid in the diagnosis of influenza from Nasopharyngeal swab specimens and should not be used as a sole basis for treatment. Nasal washings and aspirates are unacceptable for Xpert Xpress SARS-CoV-2/FLU/RSV testing.  Fact Sheet for Patients: EntrepreneurPulse.com.au  Fact Sheet for Healthcare Providers: IncredibleEmployment.be  This test is not yet approved or cleared by the [place] FDA and has been authorized for detection and/or diagnosis of SARS-CoV-2 by FDA under an Emergency Use Authorization (EUA). This EUA will remain in effect (meaning this test can be used) for the duration of the COVID-19 declaration under Section 564(b)(1) of the Act, 21 U.S.C. section 360bbb-3(b)(1), unless the authorization is terminated or revoked.  Performed at [facility], [address] [place], [place] [address]      Labs: BNP (last 3 results) No results for input(s): BNP in the last 8760 hours. Basic Metabolic Panel: Recent Labs  Lab 06/19/21 0218 06/20/21 0435  NA 138 140  K 3.1* 3.5  CL 103 106  CO2 26 27  GLUCOSE 130* 90  BUN 19 13  CREATININE 1.03 0.83  CALCIUM 9.1 8.8*   Liver Function Tests: Recent Labs  Lab 06/19/21 0218  AST 21  ALT 15  ALKPHOS 100  BILITOT 0.5  PROT 7.2  ALBUMIN 3.9   No results for input(s): LIPASE, AMYLASE in the last 168 hours. No results for input(s): AMMONIA in the last 168 hours. CBC: Recent Labs  Lab 06/19/21 0218 06/20/21 0435  WBC 13.8* 9.8  NEUTROABS 9.1* 5.5  HGB 14.1 13.1  HCT 42.8 39.9  MCV 90.9 90.1  PLT 322 330   Cardiac Enzymes: No results for input(s): CKTOTAL, CKMB, CKMBINDEX, TROPONINI in the last 168 hours. BNP: Invalid input(s): POCBNP CBG: Recent Labs  Lab 06/19/21 1158 06/19/21 1644 06/19/21 2130 06/20/21 0406 06/20/21 0709   GLUCAP 93 107* 95 92 90   D-Dimer No results for input(s): DDIMER in the last 72 hours. Hgb A1c No results for input(s): HGBA1C in the last 72 hours. Lipid Profile No results for input(s): CHOL, HDL, LDLCALC, TRIG, CHOLHDL, LDLDIRECT in the last 72 hours. Thyroid function studies No results for input(s): TSH, T4TOTAL, T3FREE, THYROIDAB in the last 72 hours.  Invalid input(s): FREET3 Anemia work up No results for input(s): VITAMINB12, FOLATE, FERRITIN, TIBC, IRON, RETICCTPCT in the last 72 hours. Urinalysis    Component Value Date/Time   COLORURINE YELLOW 06/19/2021 1558   APPEARANCEUR HAZY (A) 06/19/2021 1558   LABSPEC 1.015 06/19/2021 1558   PHURINE 6.5 06/19/2021 1558   GLUCOSEU NEGATIVE 06/19/2021 1558  HGBUR TRACE (A) 06/19/2021 1558   BILIRUBINUR NEGATIVE 06/19/2021 1558   KETONESUR NEGATIVE 06/19/2021 1558   PROTEINUR NEGATIVE 06/19/2021 1558   NITRITE NEGATIVE 06/19/2021 1558   LEUKOCYTESUR NEGATIVE 06/19/2021 1558   Sepsis Labs Invalid input(s): PROCALCITONIN,  WBC,  LACTICIDVEN Microbiology Recent Results (from the past 240 hour(s))  Resp Panel by RT-PCR (Flu A&B, Covid) Nasopharyngeal Swab     Status: None   Collection Time: 06/19/21  9:27 AM   Specimen: Nasopharyngeal Swab; Nasopharyngeal(NP) swabs in vial transport medium  Result Value Ref Range Status   SARS Coronavirus 2 by RT PCR NEGATIVE NEGATIVE Final    Comment: (NOTE) SARS-CoV-2 target nucleic acids are NOT DETECTED.  The SARS-CoV-2 RNA is generally detectable in upper respiratory specimens during the acute phase of infection. The lowest concentration of SARS-CoV-2 viral copies this assay can detect is 138 copies/mL. A negative result does not preclude SARS-Cov-2 infection and should not be used as the sole basis for treatment or other patient management decisions. A negative result may occur with  improper specimen collection/handling, submission of specimen other than nasopharyngeal swab,  presence of viral mutation(s) within the areas targeted by this assay, and inadequate number of viral copies(<138 copies/mL). A negative result must be combined with clinical observations, patient history, and epidemiological information. The expected result is Negative.  Fact Sheet for Patients:  EntrepreneurPulse.com.au  Fact Sheet for Healthcare Providers:  IncredibleEmployment.be  This test is no t yet approved or cleared by the [place] FDA and  has been authorized for detection and/or diagnosis of SARS-CoV-2 by FDA under an Emergency Use Authorization (EUA). This EUA will remain  in effect (meaning this test can be used) for the duration of the COVID-19 declaration under Section 564(b)(1) of the Act, 21 U.S.C.section 360bbb-3(b)(1), unless the authorization is terminated  or revoked sooner.       Influenza A by PCR NEGATIVE NEGATIVE Final   Influenza B by PCR NEGATIVE NEGATIVE Final    Comment: (NOTE) The Xpert Xpress SARS-CoV-2/FLU/RSV plus assay is intended as an aid in the diagnosis of influenza from Nasopharyngeal swab specimens and should not be used as a sole basis for treatment. Nasal washings and aspirates are unacceptable for Xpert Xpress SARS-CoV-2/FLU/RSV testing.  Fact Sheet for Patients: EntrepreneurPulse.com.au  Fact Sheet for Healthcare Providers: IncredibleEmployment.be  This test is not yet approved or cleared by the [place] FDA and has been authorized for detection and/or diagnosis of SARS-CoV-2 by FDA under an Emergency Use Authorization (EUA). This EUA will remain in effect (meaning this test can be used) for the duration of the COVID-19 declaration under Section 564(b)(1) of the Act, 21 U.S.C. section 360bbb-3(b)(1), unless the authorization is terminated or revoked.  Performed at [facility], [address] [place],  [address]    Time coordinating  discharge:   SIGNED:  Irwin Brakeman, MD  [facility] 06/20/2021, 11:13 AM How to contact the [facility] Attending or Consulting provider [place] or covering provider during after hours [place], for this patient?  Check the care team in [facility] and look for a) attending/consulting [facility] provider listed and b) the [facility] team listed Log into [email or web address] and use [place] universal password to access. If you do not have the password, please contact the hospital operator. Locate the [facility] provider you are looking for under [facility] and page to a number that you can be directly reached. If you still have difficulty reaching the provider, please page the [facility] (Director on  Call) for the Hospitalists listed on amion for assistance.

## 2021-06-20 NOTE — Progress Notes (Signed)
Was advised to give meds crushed in ensure patient did not tolerate tried them in applesauce this morning and pt spit a lot of the medication out. After cleaning patient up and getting comfortable I left and came back to attempt giving him his HCTZ pt refused to take it as well. MD notified. No new orders. Will continue to monitor patient closely.

## 2021-12-04 ENCOUNTER — Encounter (HOSPITAL_COMMUNITY): Payer: Self-pay | Admitting: *Deleted

## 2021-12-04 ENCOUNTER — Emergency Department (HOSPITAL_COMMUNITY): Payer: Medicare Other

## 2021-12-04 ENCOUNTER — Emergency Department (HOSPITAL_COMMUNITY)
Admission: EM | Admit: 2021-12-04 | Discharge: 2021-12-04 | Disposition: A | Discharge status: Skilled Nursing Facility | Payer: Medicare Other | Attending: Emergency Medicine | Admitting: Emergency Medicine

## 2021-12-04 ENCOUNTER — Other Ambulatory Visit: Payer: Self-pay

## 2021-12-04 DIAGNOSIS — Z7982 Long term (current) use of aspirin: Secondary | ICD-10-CM | POA: Insufficient documentation

## 2021-12-04 DIAGNOSIS — F0390 Unspecified dementia without behavioral disturbance: Secondary | ICD-10-CM | POA: Insufficient documentation

## 2021-12-04 DIAGNOSIS — Z79899 Other long term (current) drug therapy: Secondary | ICD-10-CM | POA: Diagnosis not present

## 2021-12-04 DIAGNOSIS — S0990XA Unspecified injury of head, initial encounter: Secondary | ICD-10-CM | POA: Diagnosis not present

## 2021-12-04 DIAGNOSIS — I10 Essential (primary) hypertension: Secondary | ICD-10-CM | POA: Insufficient documentation

## 2021-12-04 DIAGNOSIS — W01198A Fall on same level from slipping, tripping and stumbling with subsequent striking against other object, initial encounter: Secondary | ICD-10-CM | POA: Insufficient documentation

## 2021-12-04 NOTE — ED Notes (Signed)
ED Provider at bedside. 

## 2021-12-04 NOTE — ED Triage Notes (Signed)
Pt arrived via [facility], pt from [facility] due to a fall.  Per report, pt was found in the floor rubbing his head and sent here.  Per report, pt is at his normal, pt is nonverbal.  Reported that pt is on blood thinners.

## 2021-12-04 NOTE — ED Notes (Signed)
[facility] [employer] called to set up transportation at this time. 

## 2021-12-04 NOTE — Discharge Instructions (Signed)
Tylenol for pain.  Follow-up with your doctor if any problems

## 2021-12-04 NOTE — ED Provider Notes (Addendum)
[facility] Provider Note   CSN: [id number] Arrival date & time: 12/04/21  1502     History  Chief Complaint  Patient presents with   Seth Neal is a 66 y.o. male.  Patient lives in a nursing home and is nonverbal from a stroke.  Patient fell and hit his head.  No loss of conscious.  Patient has a history of dementia and hypertension  The history is provided by the nursing home. No language interpreter was used.  Fall This is a new problem. The current episode started less than 1 hour ago. The problem occurs constantly. The problem has been resolved. Pertinent negatives include no chest pain. Nothing aggravates the symptoms. Nothing relieves the symptoms.      Home Medications Prior to Admission medications   Medication Sig Start Date End Date Taking? Authorizing Provider  acetaminophen (TYLENOL) 325 MG tablet Take 2 tablets (650 mg total) by mouth every 6 (six) hours as needed for mild pain (or Fever >/= 101). 04/30/21   Emokpae, Courage, MD  amLODipine (NORVASC) 10 MG tablet Take 10 mg by mouth daily. 02/23/21   [provider]  aspirin EC 81 MG tablet Take 1 tablet (81 mg total) by mouth daily with breakfast. 04/30/21 04/30/22  Roxan Hockey, MD  blood glucose meter kit and supplies KIT Dispense based on patient and insurance preference. Test once every morning.Please dispense lancets, strips,alcohol pads, and etc. Dx:Ell.9  (FOR ICD-9 250.00, 250.01). 03/30/16   [name], MD  donepezil (ARICEPT) 10 MG tablet Take 10 mg by mouth at bedtime.    [provider]  enalapril (VASOTEC) 20 MG tablet Take 1 tablet (20 mg total) by mouth daily. 07/20/16   [name], MD  Ensure (ENSURE) Take 237 mLs by mouth 3 (three) times daily.    [provider]  hydrochlorothiazide (HYDRODIURIL) 12.5 MG tablet Take 1 tablet (12.5 mg total) by mouth every morning. 05/01/21   Roxan Hockey, MD  potassium chloride (KLOR-CON) 10  MEQ tablet Take 1 tablet (10 mEq total) by mouth daily. Take While taking HCTZ 04/30/21   Roxan Hockey, MD  rosuvastatin (CRESTOR) 5 MG tablet Take 5 mg by mouth daily.    [provider]      Allergies    Bee venom    Review of Systems   Review of Systems  Unable to perform ROS: Dementia  Cardiovascular:  Negative for chest pain.   Physical Exam Updated Vital Signs BP 115/82 (BP Location: Left Arm)    Pulse 91    Temp 99 F (37.2 C) (Oral)    Resp 16    SpO2 100%  Physical Exam Vitals and nursing note reviewed.  Constitutional:      Appearance: He is well-developed.  HENT:     Head: Normocephalic.     Nose: Nose normal.  Eyes:     General: No scleral icterus.    Conjunctiva/sclera: Conjunctivae normal.  Neck:     Thyroid: No thyromegaly.  Cardiovascular:     Rate and Rhythm: Normal rate and regular rhythm.     Heart sounds: No murmur heard.   No friction rub. No gallop.  Pulmonary:     Breath sounds: No stridor. No wheezing or rales.  Chest:     Chest wall: No tenderness.  Abdominal:     General: There is no distension.     Tenderness: There is no abdominal tenderness. There is no rebound.  Musculoskeletal:     °   General: Normal range of motion.  °   Cervical back: Neck supple.  °Lymphadenopathy:  °   Cervical: No cervical adenopathy.  °Skin: °   Findings: No erythema or rash.  °Neurological:  °   Mental Status: He is alert.  °   Motor: No abnormal muscle tone.  °   Coordination: Coordination normal.  °   Comments: Patient nonverbal  °Psychiatric:     °   Behavior: Behavior normal.  ° ° °ED Results / Procedures / Treatments   °Labs °(all labs ordered are listed, but only abnormal results are displayed) °Labs Reviewed - No data to display ° °EKG °None ° °Radiology °CT Head Wo Contrast ° °Result Date: 12/04/2021 °CLINICAL DATA:  Head trauma EXAM: CT HEAD WITHOUT CONTRAST TECHNIQUE: Contiguous axial images were obtained from the base of the skull through the vertex  without intravenous contrast. RADIATION DOSE REDUCTION: This exam was performed according to the departmental dose-optimization program which includes automated exposure control, adjustment of the mA and/or kV according to patient size and/or use of iterative reconstruction technique. COMPARISON:  CT head 06/19/2021 FINDINGS: Brain: No acute intracranial hemorrhage, mass effect, or herniation. No extra-axial fluid collections. No evidence of acute territorial infarct. No hydrocephalus. Moderate cortical volume loss. Extensive hypodensities throughout the periventricular and subcortical white matter, likely secondary to advanced chronic microvascular ischemic changes. Small old lacunar infarcts in the bilateral basal ganglia regions. Vascular: Calcified plaques in the carotid siphons. Skull: Normal. Negative for fracture or focal lesion. Sinuses/Orbits: No acute finding. Other: None. IMPRESSION: Chronic findings with no acute intracranial process identified. Electronically Signed   By: Delaney  Williams M.D.   On: 12/04/2021 16:39  ° °CT Cervical Spine Wo Contrast ° °Result Date: 12/04/2021 °CLINICAL DATA:  Fall, neck trauma EXAM: CT CERVICAL SPINE WITHOUT CONTRAST TECHNIQUE: Multidetector CT imaging of the cervical spine was performed without intravenous contrast. Multiplanar CT image reconstructions were also generated. RADIATION DOSE REDUCTION: This exam was performed according to the departmental dose-optimization program which includes automated exposure control, adjustment of the mA and/or kV according to patient size and/or use of iterative reconstruction technique. COMPARISON:  CT cervical spine 06/19/2021 FINDINGS: Alignment: No acute subluxation. Minimal grade 1 anterolisthesis of C7 on T1. Skull base and vertebrae: No acute fracture. No primary bone lesion or focal pathologic process. Soft tissues and spinal canal: No prevertebral fluid or swelling. No visible canal hematoma. Disc levels: Mild to moderate  intervertebral disc space narrowing from C3 through C6. Small associated uncovertebral spurring and endplate osteophytes. Upper chest: No acute process identified. Other: Multiple calculi noted in the left parotid gland. IMPRESSION: Degenerative changes with no acute fracture or subluxation identified. Electronically Signed   By: Delaney  Williams M.D.   On: 12/04/2021 16:42   ° °Procedures °Procedures  ° ° °Medications Ordered in ED °Medications - No data to display ° °ED Course/ Medical Decision Making/ A&P °  °                        °Medical Decision Making °Amount and/or Complexity of Data Reviewed °Radiology: ordered. ° °Patient with head injury and normal CT head and cervical spine.  He will be discharged back to the nursing home and Tylenol for pain ° ° ° °This patient presents to the ED for concern of fall and head injury, this involves an extensive number of treatment options, and is a complaint that carries with it   a high risk of complications and morbidity.  The differential diagnosis includes head injury, neck injury ° ° °Co morbidities that complicate the patient evaluation ° °Dementia ° ° °Additional history obtained: ° °Additional history obtained from nursing °External records from outside source obtained and reviewed including hospital records ° ° °Lab Tests: ° °I Ordered, and personally interpreted labs.  The pertinent results include: No labs ordered ° ° °Imaging Studies ordered: ° °I ordered imaging studies including CT head and cervical spine °I independently visualized and interpreted imaging which showed no acute injuries °I agree with the radiologist interpretation ° ° °Cardiac Monitoring: ° °The patient was maintained on a cardiac monitor.  I personally viewed and interpreted the cardiac monitored which showed an underlying rhythm of: Normal sinus rhythm ° ° °Medicines ordered and prescription drug management: ° °No medicine given °Reevaluation of the patient after these medicines showed  that the patient stayed the same °I have reviewed the patients home medicines and have made adjustments as needed ° ° °Test Considered: ° °MRI of the neck ° ° °Critical Interventions: ° °None ° ° °Consultations Obtained: ° °No consultants ° °Problem List / ED Course: ° °Dementia and fall ° ° °Reevaluation: ° °After the interventions noted above, I reevaluated the patient and found that they have :stayed the same ° ° °Social Determinants of Health: ° °Lives in nursing home ° ° °Dispostion: ° °After consideration of the diagnostic results and the patients response to treatment, I feel that the patent would benefit from discharge home and Tylenol or Motrin for pain.  Follow-up with PCP as needed.  ° ° ° ° ° ° ° °Final Clinical Impression(s) / ED Diagnoses °Final diagnoses:  °Injury of head, initial encounter  ° ° °Rx / DC Orders °ED Discharge Orders   ° ° None  ° °  ° ° °  °, , MD °12/06/21 1646 ° °  °, , MD °12/06/21 1650 ° °

## 2021-12-04 NOTE — ED Notes (Signed)
Attempted to call report to [facility], on hold for 10 minutes with no one to give report to.  Will try again

## 2022-04-25 ENCOUNTER — Encounter: Payer: Self-pay | Admitting: *Deleted

## 2022-05-04 ENCOUNTER — Ambulatory Visit (INDEPENDENT_AMBULATORY_CARE_PROVIDER_SITE_OTHER): Payer: Medicare Other | Admitting: Podiatry

## 2022-05-04 ENCOUNTER — Ambulatory Visit (INDEPENDENT_AMBULATORY_CARE_PROVIDER_SITE_OTHER): Payer: Medicare Other

## 2022-05-04 DIAGNOSIS — R2241 Localized swelling, mass and lump, right lower limb: Secondary | ICD-10-CM | POA: Diagnosis not present

## 2022-05-04 NOTE — Patient Instructions (Signed)
Ultrasound referral has been sent to:  [facility]  Address: [address] [place], [place] [address] Phone: [phone]

## 2022-05-04 NOTE — Progress Notes (Signed)
  Subjective:  Patient ID: Seth Neal, male    DOB: 12-05-1955,  MRN: [id number]  Chief Complaint  Patient presents with   mass    np mass on right foot   Nail Problem    Thick painful toenails, podiatry cuts his nails at his facility    66 y.o. male presents with the above complaint. History confirmed with patient.  He is in an assisted living facility.  There is a mass noted on the right foot.  It is been increasing in size they are not sure if it hurts him it does not seem to so far.  He is largely nonverbal he is able to shake his head yes or no  Objective:  Physical Exam: warm, good capillary refill, no trophic changes or ulcerative lesions, normal DP and PT pulses, normal sensory exam, and palpable firm slightly mobile mass in the first interspace distally on the right foot.   Radiographs: Multiple views x-ray of the right foot: Notable calcifications in the first interspace corresponding to the area of the mass Assessment:   1. Mass of right foot      Plan:  Patient was evaluated and treated and all questions answered.  I discussed my clinical and radiographic findings with the patient and his caretaker that was here with him today.  I discussed with him the presence of the mass.  He was unable to provide me with much history on how long it has been there but he was able to shake his head no when I asked him if it does hurt him.  I discussed that if it is not particularly painful or functionally limiting then I would recommend monitoring but I would recommend evaluating further with an ultrasound.  This has been ordered and referral was sent to [facility] location.  If there is a concerning lesion present we will consider excision.  I will see him back pending results of the ultrasound  Return if symptoms worsen or fail to improve, we will notify you if further treatment is needed.

## 2022-10-31 ENCOUNTER — Other Ambulatory Visit: Payer: Self-pay

## 2022-10-31 ENCOUNTER — Encounter (HOSPITAL_COMMUNITY): Payer: Self-pay | Admitting: Emergency Medicine

## 2022-10-31 ENCOUNTER — Emergency Department (HOSPITAL_COMMUNITY)
Admission: EM | Admit: 2022-10-31 | Discharge: 2022-10-31 | Disposition: A | Discharge status: Home / Self Care | Payer: Medicare Other | Attending: Emergency Medicine | Admitting: Emergency Medicine

## 2022-10-31 ENCOUNTER — Emergency Department (HOSPITAL_COMMUNITY): Payer: Medicare Other

## 2022-10-31 DIAGNOSIS — E119 Type 2 diabetes mellitus without complications: Secondary | ICD-10-CM | POA: Diagnosis not present

## 2022-10-31 DIAGNOSIS — U071 COVID-19: Secondary | ICD-10-CM | POA: Diagnosis not present

## 2022-10-31 DIAGNOSIS — Z7982 Long term (current) use of aspirin: Secondary | ICD-10-CM | POA: Insufficient documentation

## 2022-10-31 DIAGNOSIS — R531 Weakness: Secondary | ICD-10-CM | POA: Diagnosis present

## 2022-10-31 DIAGNOSIS — Z79899 Other long term (current) drug therapy: Secondary | ICD-10-CM | POA: Diagnosis not present

## 2022-10-31 DIAGNOSIS — F0390 Unspecified dementia without behavioral disturbance: Secondary | ICD-10-CM | POA: Insufficient documentation

## 2022-10-31 DIAGNOSIS — I10 Essential (primary) hypertension: Secondary | ICD-10-CM | POA: Diagnosis not present

## 2022-10-31 LAB — RESP PANEL BY RT-PCR (RSV, FLU A&B, COVID)  RVPGX2
Influenza A by PCR: NEGATIVE
Influenza B by PCR: NEGATIVE
Resp Syncytial Virus by PCR: NEGATIVE
SARS Coronavirus 2 by RT PCR: POSITIVE — AB

## 2022-10-31 NOTE — Discharge Instructions (Addendum)
You again tested positive for COVID-19 which is not surprising, although your chest x-ray does not suggest pneumonia at this time.  However I do recommend completing the antibiotics that you have started, also recommend completing the Paxlovid for your COVID infection as well.

## 2022-10-31 NOTE — ED Triage Notes (Signed)
Per EMS pt coming from [facility] with recent diagnosis of covid and pneumonia about 4 days ago. Per EMS family requested pt to be evaluated. Started on levaquin 2 days ago.

## 2022-10-31 NOTE — ED Provider Notes (Signed)
[facility] EMERGENCY DEPARTMENT Provider Note   CSN: [id number] Arrival date & time: 10/31/22  1216     History  Chief Complaint  Patient presents with   Weakness    Seth Neal is a 67 y.o. male with a history including hyper tension, dementia, sleep apnea, hyperlipidemia and diabetes who was diagnosed with COVID and pneumonia 4 days ago at his nursing home.  He was started on Levaquin 2 days ago.  He presents today for further evaluation of these diagnoses at family's request.  Patient has a history of CVA and is nonverbal, he also is not ambulatory, he is a total care patient.  Call initially was placed to the nursing home facility as there was some confusion over the reason for his transfer, the transfer form indicates that he had a fall, I spoke with RN Jeani Hawking who was his primary provider today who confirms he did not fall, he is here at the request of his family given his respiratory diagnoses.  She states he did have a chest x-ray and was screened for COVID, not influenza or RSV.  He was started on Paxlovid 2 days ago.  The history is provided by the patient.       Home Medications Prior to Admission medications   Medication Sig Start Date End Date Taking? Authorizing Provider  acetaminophen (TYLENOL) 325 MG tablet Take 2 tablets (650 mg total) by mouth every 6 (six) hours as needed for mild pain (or Fever >/= 101). 04/30/21  Yes Emokpae, Courage, MD  amLODipine (NORVASC) 10 MG tablet Take 10 mg by mouth daily. 02/23/21  Yes [provider]  aspirin EC 81 MG tablet Take 81 mg by mouth daily. Swallow whole.   Yes [provider]  donepezil (ARICEPT) 10 MG tablet Take 10 mg by mouth at bedtime.   Yes [provider]  enalapril (VASOTEC) 20 MG tablet Take 1 tablet (20 mg total) by mouth daily. 07/20/16  Yes [name], MD  fenofibrate (TRICOR) 48 MG tablet Take 48 mg by mouth daily.   Yes [provider]  guaiFENesin (MUCINEX) 600 MG 12 hr  tablet Take 600 mg by mouth 2 (two) times daily.   Yes [provider]  hydrochlorothiazide (HYDRODIURIL) 12.5 MG tablet Take 1 tablet (12.5 mg total) by mouth every morning. 05/01/21  Yes Emokpae, Courage, MD  levofloxacin (LEVAQUIN) 750 MG tablet Take 750 mg by mouth daily. 10/30/22 11/05/22 Yes [provider]  memantine (NAMENDA) 10 MG tablet Take 10 mg by mouth 2 (two) times daily.   Yes [provider]  mirtazapine (REMERON) 7.5 MG tablet Take 7.5 mg by mouth daily.   Yes [provider]  nirmatrelvir & ritonavir (PAXLOVID, 300/100,) 20 x 150 MG & 10 x '100MG'$  TBPK Take 1 tablet by mouth 2 (two) times daily. 10/30/22 11/03/22 Yes [provider]  potassium chloride (KLOR-CON) 10 MEQ tablet Take 1 tablet (10 mEq total) by mouth daily. Take While taking HCTZ 04/30/21  Yes Emokpae, Courage, MD  saccharomyces boulardii (FLORASTOR) 250 MG capsule Take 250 mg by mouth 2 (two) times daily.   Yes [provider]  tiZANidine (ZANAFLEX) 2 MG tablet Take 2 mg by mouth at bedtime as needed for muscle spasms. 10/17/22  Yes [provider]  blood glucose meter kit and supplies KIT Dispense based on patient and insurance preference. Test once every morning.Please dispense lancets, strips,alcohol pads, and etc. Dx:Ell.9  (FOR ICD-9 250.00, 250.01). 03/30/16   Baltazar Apo  S, MD  rosuvastatin (CRESTOR) 5 MG tablet Take 5 mg by mouth daily. Patient not taking: Reported on 10/31/2022    [provider]      Allergies    Bee venom    Review of Systems   Review of Systems  Unable to perform ROS: Dementia (pt unable to contribute to history)    Physical Exam Updated Vital Signs BP 110/73   Pulse (!) 57   Temp 98.6 F (37 C) (Oral)   Resp 14   Ht '5\' 6"'$  (1.676 m)   Wt 71.7 kg   SpO2 98%   BMI 25.50 kg/m  Physical Exam Vitals and nursing note reviewed.  Constitutional:      General: He is not in acute distress.    Appearance: He is  well-developed.  HENT:     Head: Normocephalic and atraumatic.  Eyes:     Conjunctiva/sclera: Conjunctivae normal.  Cardiovascular:     Rate and Rhythm: Normal rate and regular rhythm.     Heart sounds: Normal heart sounds.  Pulmonary:     Effort: Pulmonary effort is normal. No respiratory distress.     Breath sounds: Normal breath sounds. No wheezing.  Abdominal:     General: Bowel sounds are normal.     Palpations: Abdomen is soft.     Tenderness: There is no abdominal tenderness.  Musculoskeletal:        General: Normal range of motion.     Cervical back: Normal range of motion.  Skin:    General: Skin is warm and dry.  Neurological:     Mental Status: He is alert.     ED Results / Procedures / Treatments   Labs (all labs ordered are listed, but only abnormal results are displayed) Labs Reviewed  RESP PANEL BY RT-PCR (RSV, FLU A&B, COVID)  [id number] - Abnormal; Notable for the following components:      Result Value   SARS Coronavirus 2 by RT PCR POSITIVE (*)    All other components within normal limits    EKG None  Radiology DG Chest Portable 1 View  Result Date: 10/31/2022 CLINICAL DATA:  Pneumonia and COVID diagnosis this week. EXAM: PORTABLE CHEST 1 VIEW COMPARISON:  Chest radiograph 04/27/2021. FINDINGS: Lungs are clear. Normal heart size and mediastinal contours. No pleural effusion or pneumothorax. Visualized portions of the bones and upper abdomen are unremarkable. IMPRESSION: No acute cardiopulmonary abnormality. Electronically Signed   By: Emmit Alexanders M.D   On: 10/31/2022 13:19    Procedures Procedures    Medications Ordered in ED Medications - No data to display  ED Course/ Medical Decision Making/ A&P                             Medical Decision Making Patient is still COVID-positive, but negative for influenza and RSV per respiratory panel today.  His chest x-ray is clear with no evidence of pneumonia at this time.  However he has been started  on Levaquin, it is advised that he complete this course.  He was started on Paxlovid 2 days ago per his MAR.    After patient was discharged back to the facility, I discovered additional nursing notes indicates that he was sent here actually to get his Paxlovid started as there is some sort of difficulty from the facility getting the medication through them.  This was not relayed to me when I spoke with the nursing staff directly  at the facility.  He was not in any respiratory distress here, with chest x-ray being clear, there is not a clear indication of his need for Paxlovid at this time.  He may choose to start this medication once he receives the original prescription, however it is unlikely to be helpful at this point in time.      Amount and/or Complexity of Data Reviewed Labs: ordered.    Details: COVID-positive Radiology: ordered.    Details: Chest x-ray reviewed and I agree with interpretation, no pneumonia.           Final Clinical Impression(s) / ED Diagnoses Final diagnoses:  [id number]    Rx / DC Orders ED Discharge Orders     None         Landis Martins 10/31/22 Rayford Halsted, MD 11/01/22 [phone]

## 2022-10-31 NOTE — ED Notes (Signed)
Nurse from facility called report and said that the Paxlovid that is ordered for pt is on backorder and family felt because he hasn't been able to get it, he is getting worse. She reports family wanted him to come to the hospital for further evaluation and so that he could get the Paxlovid since it was not available at their facility at this time.

## 2022-10-31 NOTE — ED Notes (Signed)
RN attempted to call [facility] RN to give report patient coming back to facility. This RN waited on hold for 8 minutes before hanging up.

## 2023-08-17 DEATH — deceased
